# Patient Record
Sex: Female | Born: 1990 | Race: Black or African American | Hispanic: No | Marital: Single | Smoking: Never smoker
Health system: Southern US, Community
[De-identification: ages and names within clinical notes are randomized; demographics above are authoritative.]

## PROBLEM LIST (undated history)

## (undated) ENCOUNTER — Inpatient Hospital Stay (HOSPITAL_COMMUNITY): Payer: Self-pay

## (undated) DIAGNOSIS — Z789 Other specified health status: Secondary | ICD-10-CM

## (undated) HISTORY — PX: NO PAST SURGERIES: SHX2092

---
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
## 2013-05-06 ENCOUNTER — Inpatient Hospital Stay (HOSPITAL_COMMUNITY)
Admission: AD | Admit: 2013-05-06 | Discharge: 2013-05-06 | Disposition: A | Discharge status: Home / Self Care | Payer: Medicaid Other | Source: Ambulatory Visit | Attending: Obstetrics & Gynecology | Admitting: Obstetrics & Gynecology

## 2013-05-06 ENCOUNTER — Encounter (HOSPITAL_COMMUNITY): Payer: Self-pay | Admitting: *Deleted

## 2013-05-06 DIAGNOSIS — O210 Mild hyperemesis gravidarum: Secondary | ICD-10-CM | POA: Insufficient documentation

## 2013-05-06 DIAGNOSIS — O219 Vomiting of pregnancy, unspecified: Secondary | ICD-10-CM

## 2013-05-06 HISTORY — DX: Other specified health status: Z78.9

## 2013-05-06 LAB — COMPREHENSIVE METABOLIC PANEL
ALT: 28 U/L (ref 0–35)
Alkaline Phosphatase: 53 U/L (ref 39–117)
BUN: 16 mg/dL (ref 6–23)
CO2: 21 mEq/L (ref 19–32)
Calcium: 10.3 mg/dL (ref 8.4–10.5)
Creatinine, Ser: 0.54 mg/dL (ref 0.50–1.10)
GFR calc Af Amer: >90 mL/min (ref >90)
GFR calc non Af Amer: >90 mL/min (ref >90)
Glucose, Bld: 89 mg/dL (ref 70–99)
Potassium: 3.8 mEq/L (ref 3.5–5.1)
Sodium: 134 mEq/L — ABNORMAL LOW (ref 135–145)
Total Protein: 8.1 g/dL (ref 6.0–8.3)

## 2013-05-06 LAB — URINALYSIS, ROUTINE W REFLEX MICROSCOPIC
Glucose, UA: NEGATIVE mg/dL
Ketones, ur: >80 mg/dL — AB
Leukocytes, UA: NEGATIVE
Nitrite: NEGATIVE
Specific Gravity, Urine: >1.03 — ABNORMAL HIGH (ref 1.005–1.030)
Urobilinogen, UA: 1 mg/dL (ref 0.0–1.0)

## 2013-05-06 LAB — POCT PREGNANCY, URINE: Preg Test, Ur: POSITIVE — AB

## 2013-05-06 LAB — CBC
HCT: 37.6 % (ref 36.0–46.0)
Hemoglobin: 13.4 g/dL (ref 12.0–15.0)
MCH: 28.2 pg (ref 26.0–34.0)
MCHC: 35.6 g/dL (ref 30.0–36.0)
MCV: 79.2 fL (ref 78.0–100.0)
WBC: 8.1 10*3/uL (ref 4.0–10.5)

## 2013-05-06 LAB — URINE MICROSCOPIC-ADD ON

## 2013-05-06 MED ORDER — PROMETHAZINE HCL 25 MG PO TABS: 25.0000 mg | ORAL_TABLET | Freq: Four times a day (QID) | ORAL | Status: DC | PRN

## 2013-05-06 MED ORDER — DEXTROSE IN LACTATED RINGERS 5 % IV SOLN
25.0000 mg | Freq: Once | INTRAVENOUS | Status: AC
  Administered 2013-05-06: 25 mg via INTRAVENOUS
  Filled 2013-05-06: qty 1

## 2013-05-06 MED ORDER — SODIUM CHLORIDE 0.9 % IV SOLN
Freq: Once | INTRAVENOUS | Status: AC
  Administered 2013-05-06: 13:00:00 via INTRAVENOUS
  Filled 2013-05-06: qty 1000

## 2013-05-06 MED ORDER — ONDANSETRON HCL 4 MG PO TABS: 4.0000 mg | ORAL_TABLET | Freq: Four times a day (QID) | ORAL | Status: DC

## 2013-05-06 NOTE — MAU Note (Signed)
Pt reprots she has had n/v for the past 4 days. Unable to keep anything down. Had pregnancy confirmed at health dept today.

## 2013-05-06 NOTE — MAU Note (Deleted)
Patient presents with complaint of hyperemesis gravidarum X 4 days.

## 2013-05-06 NOTE — MAU Provider Note (Signed)
History     CSN: [id number]  Arrival date and time: 05/06/13 3244   First Provider Initiated Contact with Patient 05/06/13 1010      Chief Complaint  Patient presents with  . Emesis During Pregnancy   HPI Ms. [name] is a 22 y.o. G1P0 at [redacted]w[redacted]d who presents to MAU today with N/V x 4-5 days. The patient had a +UPT at [facility] today. She states that can't keep anything down. She is also having some weakness. Denies abdominal pain, vaginal bleeding, discharge, fever, dizziness, UTI symptoms, diarrhea or heartburn. Last BM was 2 days ago.   OB History   Grav Para Term Preterm Abortions TAB SAB Ect Mult Living   1               Past Medical History  Diagnosis Date  . Medical history non-contributory     Past Surgical History  Procedure Laterality Date  . No past surgeries      Family History  Problem Relation Age of Onset  . Hypertension Mother   . Diabetes Maternal Grandmother     History  Substance Use Topics  . Smoking status: Never Smoker   . Smokeless tobacco: Never Used  . Alcohol Use: Yes    Allergies: No Known Allergies  No prescriptions prior to admission    Review of Systems  Constitutional: Negative for fever and malaise/fatigue.  Gastrointestinal: Positive for nausea, vomiting and constipation. Negative for abdominal pain and diarrhea.  Genitourinary: Negative for dysuria, urgency and frequency.       Neg - vaginal bleeding, discharge  Neurological: Positive for weakness. Negative for dizziness and loss of consciousness.   Physical Exam   Blood pressure 111/54, pulse 98, temperature 98.6 F (37 C), temperature source Oral, resp. rate 18, height 5' 5.5" (1.664 m), weight 101 lb 8 oz (46.04 kg), last menstrual period 03/19/2013.  Physical Exam  Constitutional: She is oriented to person, place, and time. She appears well-developed and well-nourished. No distress.  HENT:  Head: Normocephalic and atraumatic.  Cardiovascular: Normal rate, regular  rhythm and normal heart sounds.   Respiratory: Effort normal and breath sounds normal. No respiratory distress.  GI: Soft. Bowel sounds are normal. She exhibits no distension and no mass. There is tenderness (mild epigastric discomfort). There is no rebound and no guarding.  Neurological: She is alert and oriented to person, place, and time.  Skin: Skin is warm and dry. No erythema.  Psychiatric: She has a normal mood and affect.   Results for orders placed during the hospital encounter of 05/06/13 (from the past 24 hour(s))  URINALYSIS, ROUTINE W REFLEX MICROSCOPIC     Status: Abnormal   Collection Time    05/06/13  9:51 AM      Result Value Range   Color, Urine YELLOW  YELLOW   APPearance CLEAR  CLEAR   Specific Gravity, Urine >1.030 (*) 1.005 - 1.030   pH 6.0  5.0 - 8.0   Glucose, UA NEGATIVE  NEGATIVE mg/dL   Hgb urine dipstick TRACE (*) NEGATIVE   Bilirubin Urine SMALL (*) NEGATIVE   Ketones, ur >80 (*) NEGATIVE mg/dL   Protein, ur 010 (*) NEGATIVE mg/dL   Urobilinogen, UA 1.0  0.0 - 1.0 mg/dL   Nitrite NEGATIVE  NEGATIVE   Leukocytes, UA NEGATIVE  NEGATIVE  URINE MICROSCOPIC-ADD ON     Status: None   Collection Time    05/06/13  9:51 AM      Result Value Range  Squamous Epithelial / LPF RARE  RARE   WBC, UA 0-2  <3 WBC/hpf   RBC / HPF 0-2  <3 RBC/hpf   Bacteria, UA RARE  RARE   Urine-Other MUCOUS PRESENT    POCT PREGNANCY, URINE     Status: Abnormal   Collection Time    05/06/13  9:54 AM      Result Value Range   Preg Test, Ur POSITIVE (*) NEGATIVE  CBC     Status: None   Collection Time    05/06/13 11:00 AM      Result Value Range   WBC 8.1  4.0 - 10.5 K/uL   RBC 4.75  3.87 - 5.11 MIL/uL   Hemoglobin 13.4  12.0 - 15.0 g/dL   HCT 91.4  78.2 - 95.6 %   MCV 79.2  78.0 - 100.0 fL   MCH 28.2  26.0 - 34.0 pg   MCHC 35.6  30.0 - 36.0 g/dL   RDW 21.3  08.6 - 57.8 %   Platelets 300  150 - 400 K/uL  COMPREHENSIVE METABOLIC PANEL     Status: Abnormal   Collection Time     05/06/13 11:00 AM      Result Value Range   Sodium 134 (*) 135 - 145 mEq/L   Potassium 3.8  3.5 - 5.1 mEq/L   Chloride 97  96 - 112 mEq/L   CO2 21  19 - 32 mEq/L   Glucose, Bld 89  70 - 99 mg/dL   BUN 16  6 - 23 mg/dL   Creatinine, Ser 4.69  0.50 - 1.10 mg/dL   Calcium 62.9  8.4 - 52.8 mg/dL   Total Protein 8.1  6.0 - 8.3 g/dL   Albumin 4.4  3.5 - 5.2 g/dL   AST 35  0 - 37 U/L   ALT 28  0 - 35 U/L   Alkaline Phosphatase 53  39 - 117 U/L   Total Bilirubin 0.6  0.3 - 1.2 mg/dL   GFR calc non Af Amer >90  >90 mL/min   GFR calc Af Amer >90  >90 mL/min     MAU Course  Procedures None  MDM +UPT UA shows signs of dehydration 25 mg IV Phenergan infusion in 1 liter D5LR CBC, CMP today 1 liter NS with MV given Patient able to tolerate PO in MAU  Assessment and Plan  A: Nausea and vomiting in pregnancy prior to [redacted] weeks gestation  P: Discharge home Rx for Phenergan and Zofran sent to patient's pharmacy Discussed hyperemesis diet and first trimester warning signs Patient unsure of where she would like to go for prenatal care. Given a list of area OB providers Patient may return to MAU as needed or if her condition were to change or worsen  Freddi Starr, PA-C  05/06/2013, 2:28 PM

## 2013-05-06 NOTE — MAU Note (Signed)
Patient presents with complaint of vomiting X 4 days.

## 2013-05-09 NOTE — MAU Provider Note (Signed)
Attestation of Attending Supervision of Advanced Practitioner (CNM/NP): Evaluation and management procedures were performed by the Advanced Practitioner under my supervision and collaboration. I have reviewed the Advanced Practitioner's note and chart, and I agree with the management and plan.  LEGGETT,KELLY H. 12:51 PM

## 2013-05-27 LAB — OB RESULTS CONSOLE HIV ANTIBODY (ROUTINE TESTING): HIV: NONREACTIVE

## 2013-05-27 LAB — CULTURE, OB URINE: Urine Culture, OB: NEGATIVE

## 2013-05-27 LAB — OB RESULTS CONSOLE VARICELLA ZOSTER ANTIBODY, IGG: Varicella: NON-IMMUNE/NOT IMMUNE

## 2013-05-27 LAB — OB RESULTS CONSOLE RUBELLA ANTIBODY, IGM: Rubella: IMMUNE

## 2013-05-27 LAB — OB RESULTS CONSOLE ANTIBODY SCREEN: Antibody Screen: NEGATIVE

## 2013-05-27 LAB — CYSTIC FIBROSIS DIAGNOSTIC STUDY: Interpretation-CFDNA:: NEGATIVE

## 2013-05-27 LAB — OB RESULTS CONSOLE ABO/RH: RH Type: POSITIVE

## 2013-05-27 LAB — OB RESULTS CONSOLE GC/CHLAMYDIA
Chlamydia: NEGATIVE
Gonorrhea: NEGATIVE

## 2013-05-27 LAB — OB RESULTS CONSOLE HEPATITIS B SURFACE ANTIGEN: Hepatitis B Surface Ag: NEGATIVE

## 2013-05-27 LAB — DRUG SCREEN, URINE: Drug Screen, Urine: NEGATIVE

## 2013-05-27 LAB — OB RESULTS CONSOLE RPR: RPR: NONREACTIVE

## 2013-06-15 ENCOUNTER — Other Ambulatory Visit (HOSPITAL_COMMUNITY): Payer: Self-pay | Admitting: Family

## 2013-06-15 DIAGNOSIS — Z3682 Encounter for antenatal screening for nuchal translucency: Secondary | ICD-10-CM

## 2013-06-16 ENCOUNTER — Other Ambulatory Visit (HOSPITAL_COMMUNITY): Payer: Self-pay | Admitting: Family

## 2013-06-16 ENCOUNTER — Ambulatory Visit (HOSPITAL_COMMUNITY)
Admission: RE | Admit: 2013-06-16 | Discharge: 2013-06-16 | Disposition: A | Discharge status: Home / Self Care | Payer: Medicaid Other | Source: Ambulatory Visit | Attending: Sports Medicine | Admitting: Sports Medicine

## 2013-06-16 ENCOUNTER — Ambulatory Visit (HOSPITAL_COMMUNITY): Admission: RE | Admit: 2013-06-16 | Payer: Medicaid Other | Source: Ambulatory Visit

## 2013-06-16 ENCOUNTER — Ambulatory Visit (HOSPITAL_COMMUNITY)
Admission: RE | Admit: 2013-06-16 | Discharge: 2013-06-16 | Disposition: A | Discharge status: Home / Self Care | Payer: Medicaid Other | Source: Ambulatory Visit

## 2013-06-16 DIAGNOSIS — O3680X0 Pregnancy with inconclusive fetal viability, not applicable or unspecified: Secondary | ICD-10-CM | POA: Insufficient documentation

## 2013-06-16 DIAGNOSIS — Z3682 Encounter for antenatal screening for nuchal translucency: Secondary | ICD-10-CM

## 2013-06-16 DIAGNOSIS — Z3689 Encounter for other specified antenatal screening: Secondary | ICD-10-CM | POA: Insufficient documentation

## 2013-06-16 IMAGING — US US MFM OB COMPLETE +14 WKS
1 series · 13 of 24 positions shown · non-contrast
Comparison: none

[Series 1: us mfm ob complete +14 wks · 0.21mm/px · 13 of 24 slices shown]
[im 1/24]
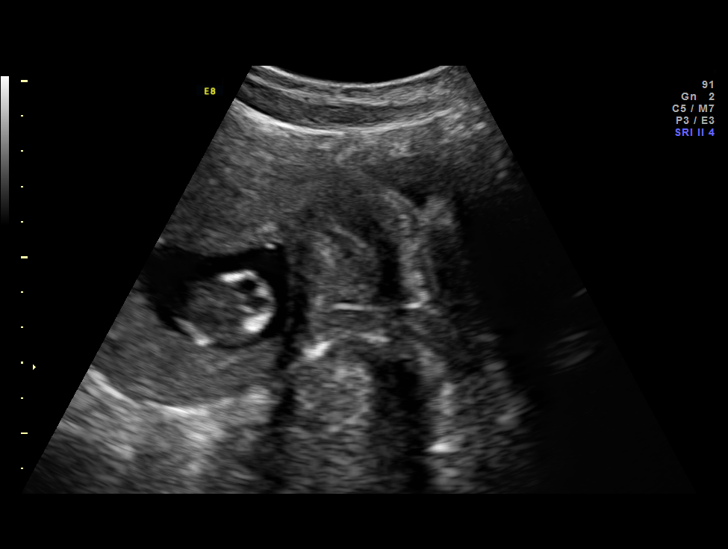
[im 3/24]
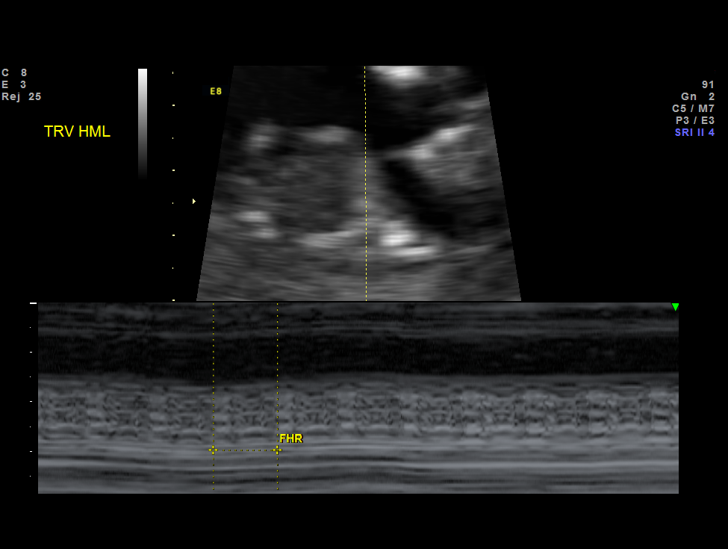
[im 5/24]
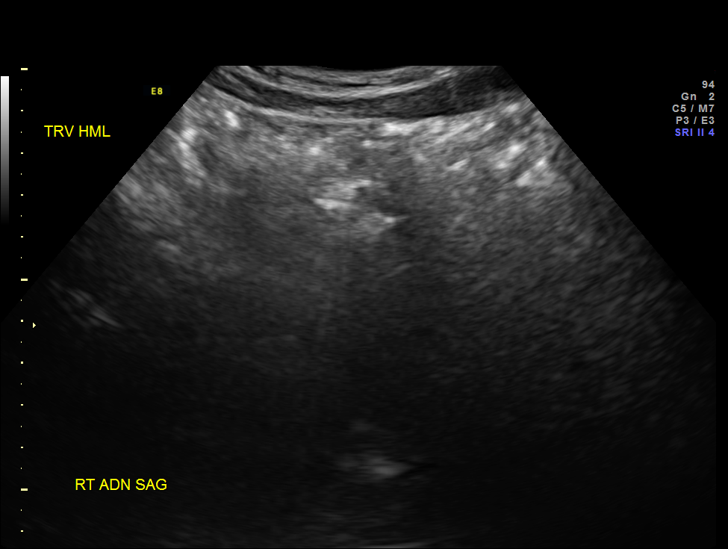
[im 7/24]
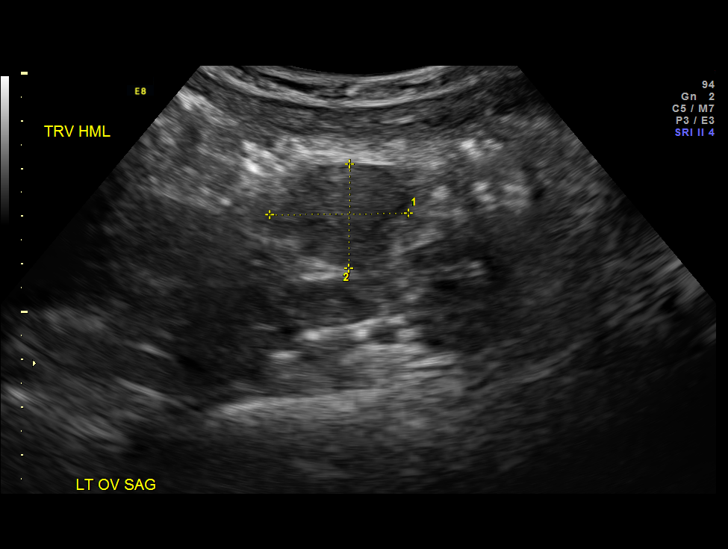
[im 9/24]
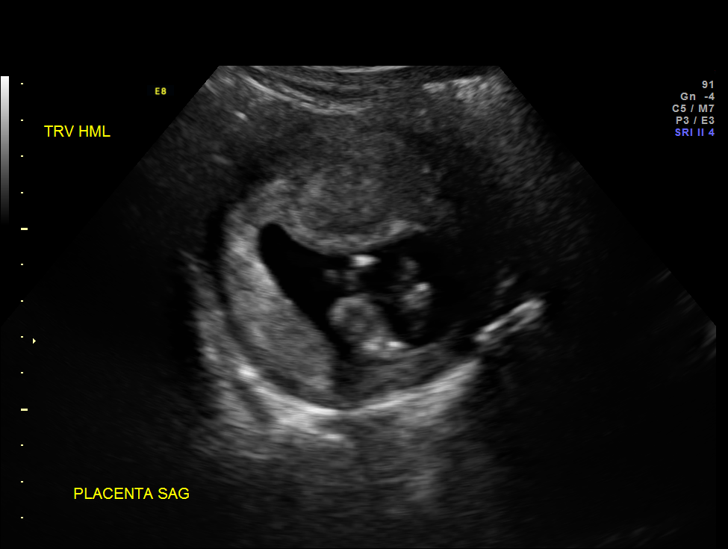
[im 11/24]
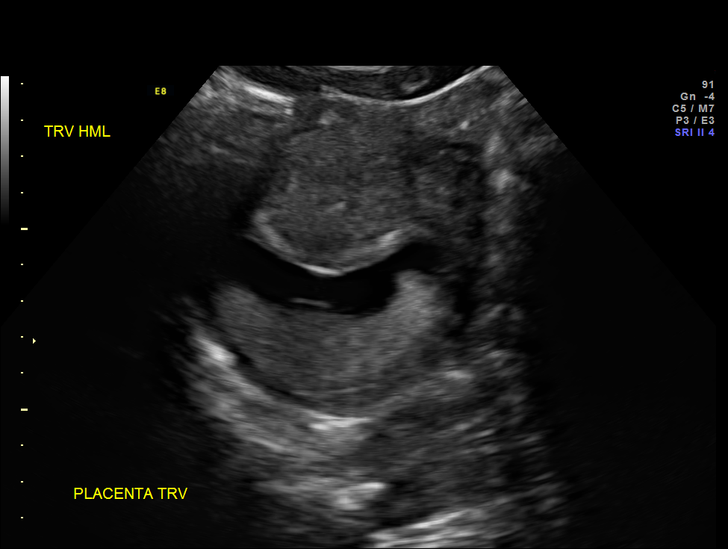
[im 13/24]
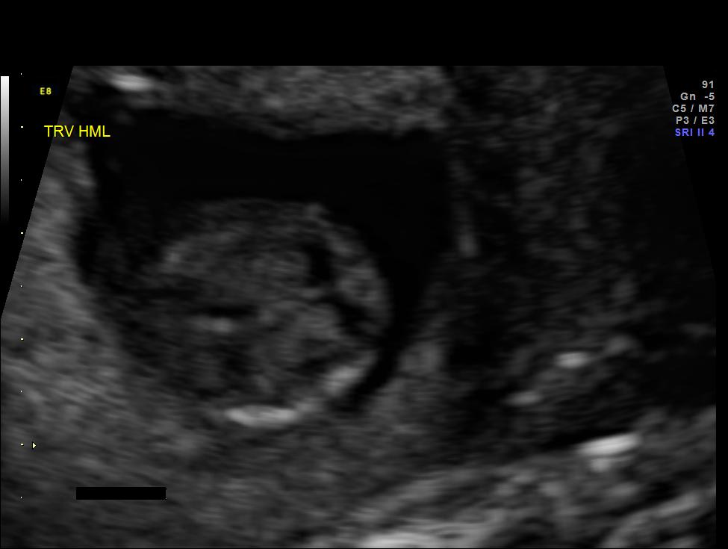
[im 14/24]
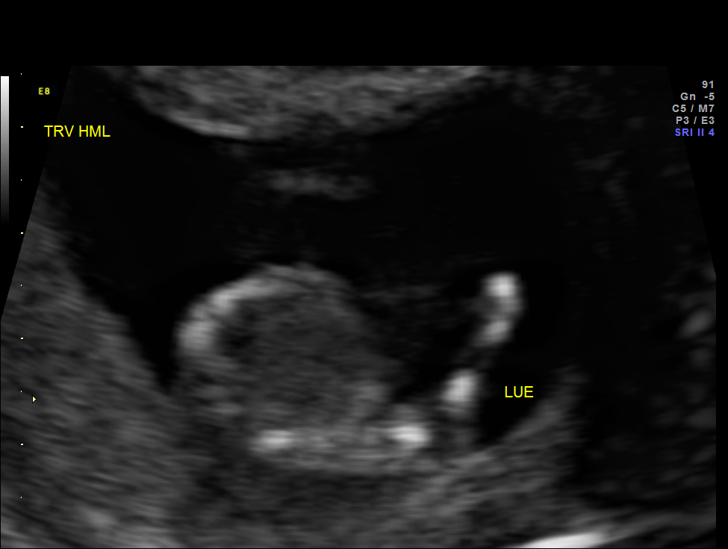
[im 16/24]
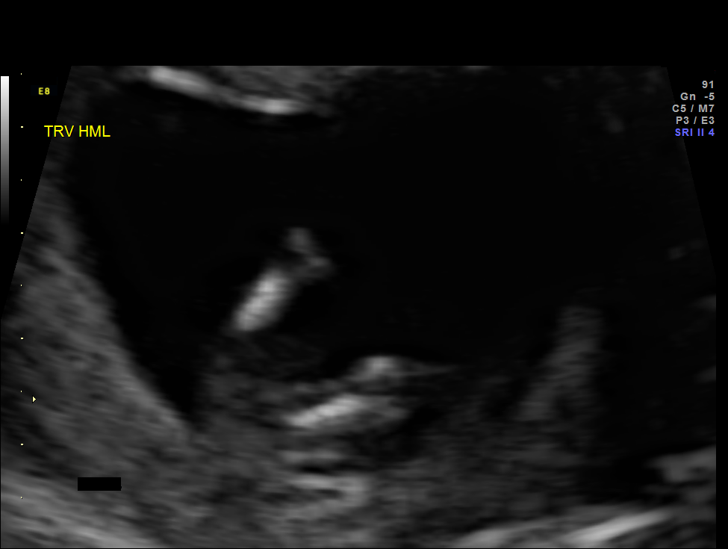
[im 18/24]
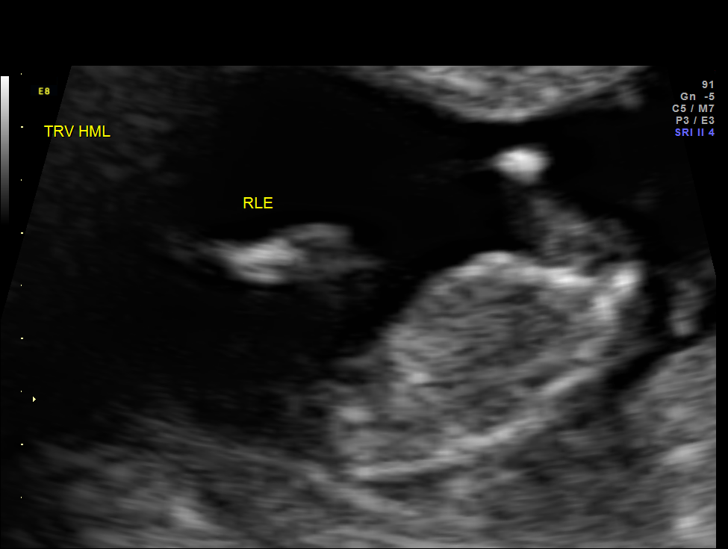
[im 20/24]
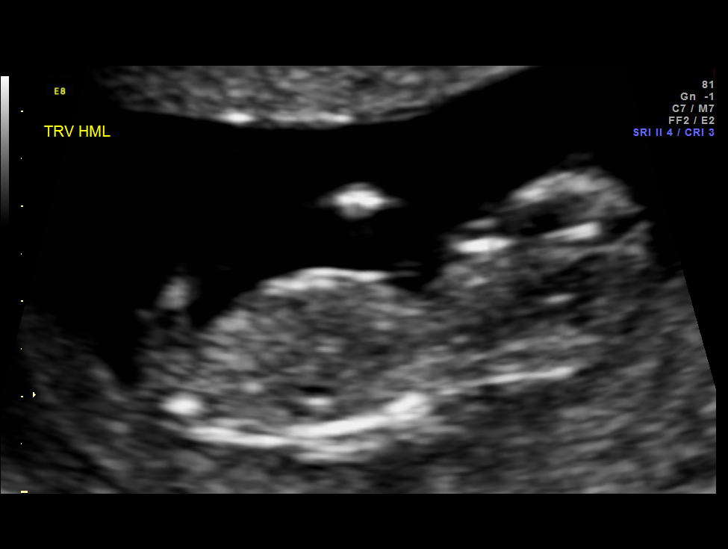
[im 22/24]
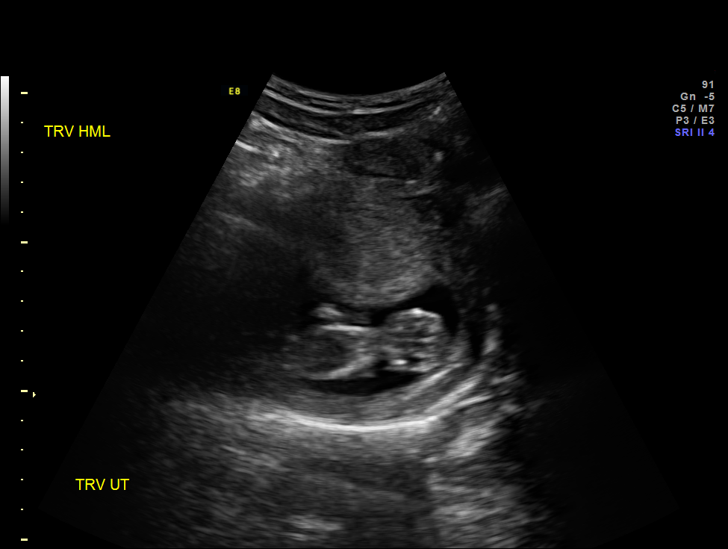
[im 24/24]
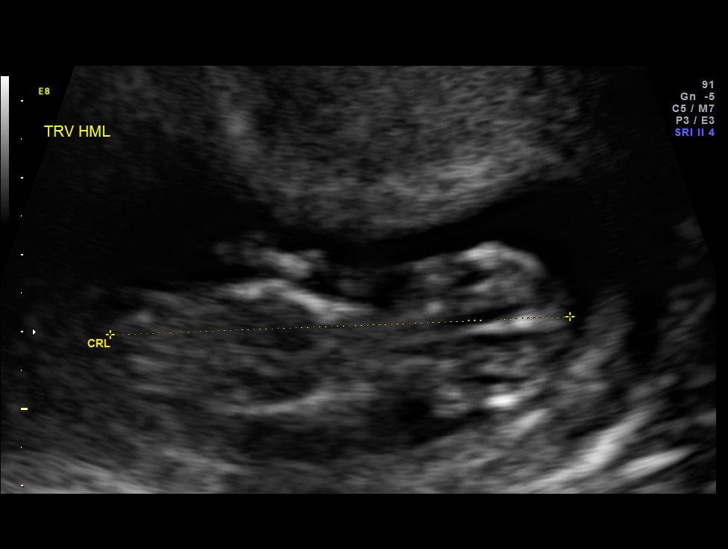

[13 of 24 positions shown; findings below may reference images not displayed]

OBSTETRICS REPORT
                      (Signed Final [DATE] [DATE])

Service(s) Provided

 US MFM OB COMP LESS THAN 14 WEEKS                     76801.4
Indications

 Pregnancy with inconclusive fetal viability           [BY]
Fetal Evaluation

 Num Of Fetuses:    1
 Fetal Heart Rate:  157                          bpm
 Cardiac Activity:  Observed
 Presentation:      Transverse, head to
                    maternal left

 Amniotic Fluid
 AFI FV:      Subjectively within normal limits
Biometry

 CRL:     60.1  mm     G. Age:  12w 2d                 EDD:    [DATE]
Gestational Age

 LMP:           12w 5d        Date:  [DATE]                 EDD:   [DATE]
 Best:          12w 5d     Det. By:  LMP  ([DATE])          EDD:   [DATE]
Anatomy

 Cranium:          Appears normal         Cord Vessels:     Appears normal (3
                                                            vessel cord)
 Choroid Plexus:   Appears normal         Lower             Visualized
                                          Extremities:
 Stomach:          Appears normal, left   Upper             Visualized
                   sided                  Extremities:

 Other:  Technically difficult due to uterine position.
Cervix Uterus Adnexa

 Cervix:       Normal appearance by transabdominal scan.
 Uterus:       Retroverted.

 Left Ovary:    Within normal limits.
 Right Ovary:   Not visualized.
 Adnexa:     No abnormality visualized.
Impression

 Single IUP at 12 [DATE] weeks
 Unable to measure an andequate NT due to fetal position
Recommendations

 Recommend follow up next week for second attempt at first
 trimester screen.

 questions or concerns.

## 2013-06-17 NOTE — L&D Delivery Note (Signed)
Delivery Note Called to patient room for delivery. Patient pushed over intact perineum. At 10:07 PM a viable female was delivered via Vaginal, Spontaneous Delivery (Presentation: Right Occiput Anterior).  Infant delivered to maternal abdomen. APGAR: 9, 9; weight pending. Cord clamped x 2 and cut. Active management of third stage with traction, started Pitocin once placenta delivered. Placenta status: Intact, Spontaneous.  Cord: 3 vessels with the following complications: None.  Cord pH: n/a. Laceration repaired as below in usual fashion. Counts correct. Hemostatic. All questions answered, no additional patient concerns.   Anesthesia: Local  Episiotomy: None Lacerations: 1st degree;Perineal. 2nd degree vaginal Suture Repair: 3.0 vicryl rapide Est. Blood Loss (mL): 250  Mom to postpartum.  Baby to Couplet care / Skin to Skin. CNM present for entire delivery.   Sunnie Nielsenlexander, Natalie 12/12/2013, 10:43 PM

## 2013-06-17 NOTE — L&D Delivery Note (Signed)
I was present for delivery and agree with note above. Walidah N [name], CNM  

## 2013-06-22 ENCOUNTER — Other Ambulatory Visit (HOSPITAL_COMMUNITY): Payer: Self-pay | Admitting: Nurse Practitioner

## 2013-06-22 DIAGNOSIS — Z3682 Encounter for antenatal screening for nuchal translucency: Secondary | ICD-10-CM

## 2013-06-24 ENCOUNTER — Ambulatory Visit (HOSPITAL_COMMUNITY)
Admission: RE | Admit: 2013-06-24 | Discharge: 2013-06-24 | Disposition: A | Discharge status: Home / Self Care | Payer: Medicaid Other | Source: Ambulatory Visit | Attending: Nurse Practitioner | Admitting: Nurse Practitioner

## 2013-06-24 ENCOUNTER — Other Ambulatory Visit (HOSPITAL_COMMUNITY): Payer: Self-pay | Admitting: Nurse Practitioner

## 2013-06-24 DIAGNOSIS — Z3682 Encounter for antenatal screening for nuchal translucency: Secondary | ICD-10-CM

## 2013-06-24 DIAGNOSIS — Z3689 Encounter for other specified antenatal screening: Secondary | ICD-10-CM

## 2013-06-24 DIAGNOSIS — O3510X0 Maternal care for (suspected) chromosomal abnormality in fetus, unspecified, not applicable or unspecified: Secondary | ICD-10-CM | POA: Insufficient documentation

## 2013-06-24 DIAGNOSIS — O351XX0 Maternal care for (suspected) chromosomal abnormality in fetus, not applicable or unspecified: Secondary | ICD-10-CM | POA: Insufficient documentation

## 2013-06-29 ENCOUNTER — Other Ambulatory Visit: Payer: Self-pay

## 2013-08-05 ENCOUNTER — Ambulatory Visit (HOSPITAL_COMMUNITY)
Admission: RE | Admit: 2013-08-05 | Discharge: 2013-08-05 | Disposition: A | Discharge status: Home / Self Care | Payer: Medicaid Other | Source: Ambulatory Visit | Attending: Nurse Practitioner | Admitting: Nurse Practitioner

## 2013-08-05 DIAGNOSIS — Z3689 Encounter for other specified antenatal screening: Secondary | ICD-10-CM | POA: Insufficient documentation

## 2013-09-14 ENCOUNTER — Observation Stay (HOSPITAL_COMMUNITY)
Admission: AD | Admit: 2013-09-14 | Discharge: 2013-09-16 | Disposition: A | Discharge status: Home / Self Care | Payer: Medicaid Other | Source: Ambulatory Visit | Attending: Obstetrics & Gynecology | Admitting: Obstetrics & Gynecology

## 2013-09-14 ENCOUNTER — Encounter (HOSPITAL_COMMUNITY): Payer: Self-pay | Admitting: *Deleted

## 2013-09-14 ENCOUNTER — Inpatient Hospital Stay (HOSPITAL_COMMUNITY): Payer: Medicaid Other

## 2013-09-14 DIAGNOSIS — O4700 False labor before 37 completed weeks of gestation, unspecified trimester: Principal | ICD-10-CM | POA: Insufficient documentation

## 2013-09-14 DIAGNOSIS — R1032 Left lower quadrant pain: Secondary | ICD-10-CM | POA: Insufficient documentation

## 2013-09-14 LAB — URINALYSIS, ROUTINE W REFLEX MICROSCOPIC
BILIRUBIN URINE: NEGATIVE
GLUCOSE, UA: NEGATIVE mg/dL
HGB URINE DIPSTICK: NEGATIVE
Ketones, ur: NEGATIVE mg/dL
Leukocytes, UA: NEGATIVE
Nitrite: NEGATIVE
PROTEIN: 30 mg/dL — AB
Specific Gravity, Urine: >1.03 — ABNORMAL HIGH (ref 1.005–1.030)
Urobilinogen, UA: 0.2 mg/dL (ref 0.0–1.0)
pH: 6 (ref 5.0–8.0)

## 2013-09-14 LAB — URINE MICROSCOPIC-ADD ON

## 2013-09-14 MED ORDER — LACTATED RINGERS IV BOLUS (SEPSIS)
500.0000 mL | Freq: Once | INTRAVENOUS | Status: AC
  Administered 2013-09-14: 500 mL via INTRAVENOUS

## 2013-09-14 MED ORDER — INDOMETHACIN 25 MG PO CAPS
50.0000 mg | ORAL_CAPSULE | Freq: Once | ORAL | Status: AC
  Administered 2013-09-14: 50 mg via ORAL
  Filled 2013-09-14: qty 2

## 2013-09-14 MED ORDER — ACETAMINOPHEN 325 MG PO TABS: 650.0000 mg | ORAL_TABLET | ORAL | Status: DC | PRN

## 2013-09-14 MED ORDER — NIFEDIPINE 10 MG PO CAPS
20.0000 mg | ORAL_CAPSULE | Freq: Once | ORAL | Status: AC
  Administered 2013-09-14: 20 mg via ORAL
  Filled 2013-09-14: qty 2

## 2013-09-14 MED ORDER — INDOMETHACIN 25 MG PO CAPS
25.0000 mg | ORAL_CAPSULE | Freq: Four times a day (QID) | ORAL | Status: DC
  Administered 2013-09-15 – 2013-09-16 (×5): 25 mg via ORAL
  Filled 2013-09-14 (×6): qty 1

## 2013-09-14 MED ORDER — DOCUSATE SODIUM 100 MG PO CAPS
100.0000 mg | ORAL_CAPSULE | Freq: Every day | ORAL | Status: DC
  Administered 2013-09-15: 100 mg via ORAL
  Filled 2013-09-14: qty 1

## 2013-09-14 MED ORDER — NIFEDIPINE 10 MG PO CAPS
10.0000 mg | ORAL_CAPSULE | Freq: Once | ORAL | Status: AC
  Administered 2013-09-14: 10 mg via ORAL
  Filled 2013-09-14: qty 1

## 2013-09-14 MED ORDER — INDOMETHACIN 25 MG PO CAPS
25.0000 mg | ORAL_CAPSULE | Freq: Once | ORAL | Status: AC
  Administered 2013-09-14: 25 mg via ORAL
  Filled 2013-09-14: qty 1

## 2013-09-14 MED ORDER — BETAMETHASONE SOD PHOS & ACET 6 (3-3) MG/ML IJ SUSP
12.0000 mg | INTRAMUSCULAR | Status: AC
  Administered 2013-09-14 – 2013-09-16 (×2): 12 mg via INTRAMUSCULAR
  Filled 2013-09-14 (×2): qty 2

## 2013-09-14 MED ORDER — NIFEDIPINE 10 MG PO CAPS: 10.0000 mg | ORAL_CAPSULE | Freq: Once | ORAL | Status: DC

## 2013-09-14 MED ORDER — ZOLPIDEM TARTRATE 5 MG PO TABS: 5.0000 mg | ORAL_TABLET | Freq: Every evening | ORAL | Status: DC | PRN

## 2013-09-14 MED ORDER — LACTATED RINGERS IV SOLN
INTRAVENOUS | Status: DC
  Administered 2013-09-15: 125 mL/h via INTRAVENOUS
  Administered 2013-09-15 (×2): via INTRAVENOUS

## 2013-09-14 MED ORDER — CALCIUM CARBONATE ANTACID 500 MG PO CHEW: 2.0000 | CHEWABLE_TABLET | ORAL | Status: DC | PRN

## 2013-09-14 MED ORDER — PRENATAL MULTIVITAMIN CH
1.0000 | ORAL_TABLET | Freq: Every day | ORAL | Status: DC
  Administered 2013-09-15: 1 via ORAL
  Filled 2013-09-14: qty 1

## 2013-09-14 NOTE — Progress Notes (Signed)
-   GBS collected and sent.

## 2013-09-14 NOTE — MAU Note (Signed)
Patient states she started having sharp pain in the lower left abdomen yesterday, getting worse. Denies bleeding, leaking, nausea, vomiting or diarrhea. Reports good fetal movement.

## 2013-09-14 NOTE — Progress Notes (Signed)
Another pitcher water to pt

## 2013-09-14 NOTE — Progress Notes (Signed)
Transducer d/ced per Wynelle BourgeoisMarie Williams CNM and will continue toco for ctxs

## 2013-09-14 NOTE — Progress Notes (Signed)
Report called to W. Muhammed CNM regarding pt's admission. Aware of ctx pattern and pushing po flds to pt. Will see pt

## 2013-09-14 NOTE — MAU Provider Note (Signed)
History     CSN: [id number]  Arrival date and time: 09/14/13 1528   First Provider Initiated Contact with Patient 09/14/13 2257     Chief Complaint  Patient presents with  . Abdominal Pain   HPI This is a 23 y.o. female at 8158w4d who ptesents with c/o pain in LLQ, sharp. Denies leaking or bleeding.   RN Note;  Patient states she started having sharp pain in the lower left abdomen yesterday, getting worse. Denies bleeding, leaking, nausea, vomiting or diarrhea. Reports good fetal movement.        OB History   Grav Para Term Preterm Abortions TAB SAB Ect Mult Living   1               Past Medical History  Diagnosis Date  . Medical history non-contributory     Past Surgical History  Procedure Laterality Date  . No past surgeries      Family History  Problem Relation Age of Onset  . Hypertension Mother   . Diabetes Maternal Grandmother     History  Substance Use Topics  . Smoking status: Never Smoker   . Smokeless tobacco: Never Used  . Alcohol Use: No    Allergies: No Known Allergies  Prescriptions prior to admission  Medication Sig Dispense Refill  . flintstones complete (FLINTSTONES) 60 MG chewable tablet Chew 2 tablets by mouth daily.        Review of Systems  Constitutional: Negative for fever, chills and malaise/fatigue.  Gastrointestinal: Positive for abdominal pain. Negative for nausea, vomiting, diarrhea and constipation.  Genitourinary: Negative for dysuria.  Neurological: Negative for dizziness.   Physical Exam   Blood pressure 118/68, pulse 80, temperature 99 F (37.2 C), temperature source Oral, resp. rate 16, height 5\' 5"  (1.651 m), weight 52.708 kg (116 lb 3.2 oz), last menstrual period 03/19/2013, SpO2 100.00%.  Physical Exam  Constitutional: She is oriented to person, place, and time. She appears well-developed and well-nourished. No distress.  Cardiovascular: Normal rate.   Respiratory: Effort normal.  GI: She exhibits no  distension. There is tenderness. There is no rebound and no guarding.  Genitourinary: Vagina normal and uterus normal. No vaginal discharge found.  Dilation: Closed Effacement (%): 20 Exam by:: Wynelle BourgeoisMarie Williams CNM   Musculoskeletal: Normal range of motion.  Neurological: She is alert and oriented to person, place, and time.  Skin: Skin is warm and dry.  Psychiatric: She has a normal mood and affect.    MAU Course  Procedures  MDM  1930: RN reports unchanged contraction pattern; IVF's ordered  1955: Has had Procardia x several doses. UCs have decreased somewhat but are still occurring.            Still having some contractions. Discussed with Dr Penne LashLeggett, will try indocin.   Assessment and Plan  Report to oncoming CNM  [facility] Center[facility]ILLIAMS,MARIE 09/14/2013, 4:29 PM   Results for orders placed during the hospital encounter of 09/14/13 (from the past 48 hour(s))  URINALYSIS, ROUTINE W REFLEX MICROSCOPIC     Status: Abnormal   Collection Time    09/14/13  3:45 PM      Result Value Ref Range   Color, Urine YELLOW  YELLOW   APPearance CLEAR  CLEAR   Specific Gravity, Urine >1.030 (*) 1.005 - 1.030   pH 6.0  5.0 - 8.0   Glucose, UA NEGATIVE  NEGATIVE mg/dL   Hgb urine dipstick NEGATIVE  NEGATIVE   Bilirubin Urine NEGATIVE  NEGATIVE   Ketones,  ur NEGATIVE  NEGATIVE mg/dL   Protein, ur 30 (*) NEGATIVE mg/dL   Urobilinogen, UA 0.2  0.0 - 1.0 mg/dL   Nitrite NEGATIVE  NEGATIVE   Leukocytes, UA NEGATIVE  NEGATIVE  URINE MICROSCOPIC-ADD ON     Status: Abnormal   Collection Time    09/14/13  3:45 PM      Result Value Ref Range   Squamous Epithelial / LPF FEW (*) RARE   WBC, UA 0-2  <3 WBC/hpf   Urine-Other MUCOUS PRESENT      Dorathy Kinsman, CNM assumed care of pt.  OB limited CL 3.1 cm. Vtx.  Contractions decreased in strength and frequency, but still tracing. Pt barely aware of them any more. VE unchanged.  Consulted w/ Dr. Penne Lash.  Will Obs on antenatal and give BMZ. Continue  Indocin.  [place], CNM 09/15/2013 11:24 AM

## 2013-09-15 DIAGNOSIS — O4700 False labor before 37 completed weeks of gestation, unspecified trimester: Principal | ICD-10-CM

## 2013-09-15 NOTE — H&P (Signed)
History    CSN: [id number]  Arrival date and time: 09/14/13 1528  First Provider Initiated Contact with Patient 09/14/13 2257  Chief Complaint   Patient presents with   .  Abdominal Pain    HPI  This is a 23 y.o. female at 7045w4d who ptesents with c/o pain in LLQ, sharp. Denies leaking or bleeding.   [facility] Pregnancy Uncomplicated. Last IC >24 hours ago.   OB History    Grav  Para  Term  Preterm  Abortions  TAB  SAB  Ect  Mult  Living    1               Past Medical History   Diagnosis  Date   .  Medical history non-contributory     Past Surgical History   Procedure  Laterality  Date   .  No past surgeries      Family History   Problem  Relation  Age of Onset   .  Hypertension  Mother    .  Diabetes  Maternal Grandmother     History   Substance Use Topics   .  Smoking status:  Never Smoker   .  Smokeless tobacco:  Never Used   .  Alcohol Use:  No    Allergies: No Known Allergies  Prescriptions prior to admission   Medication  Sig  Dispense  Refill   .  flintstones complete (FLINTSTONES) 60 MG chewable tablet  Chew 2 tablets by mouth daily.      Review of Systems  Constitutional: Negative for fever, chills and malaise/fatigue.  Gastrointestinal: Positive for abdominal pain. Negative for nausea, vomiting, diarrhea and constipation.  Genitourinary: Negative for dysuria.  Neurological: Negative for dizziness.   Physical Exam   Blood pressure 118/68, pulse 80, temperature 99 F (37.2 C), temperature source Oral, resp. rate 16, height 5\' 5"  (1.651 m), weight 52.708 kg (116 lb 3.2 oz), last menstrual period 03/19/2013, SpO2 100.00%.  Physical Exam  Constitutional: She is oriented to person, place, and time. She appears well-developed and well-nourished. No distress.  Cardiovascular: Normal rate.  Respiratory: Effort normal.  GI: She exhibits no distension. There is tenderness. There is no rebound and no guarding.  Genitourinary: Vagina normal and uterus  normal. No vaginal discharge found.  Dilation: Closed Effacement (%): 20 Exam by:: Wynelle BourgeoisMarie Williams CNM  Musculoskeletal: Normal range of motion.  Neurological: She is alert and oriented to person, place, and time.  Skin: Skin is warm and dry.  Psychiatric: She has a normal mood and affect.   MAU Course   Procedures  MDM  1930: RN reports unchanged contraction pattern; IVF's ordered  1955: Has had Procardia x several doses. UCs have decreased somewhat but are still occurring.  Still having some contractions. Discussed with Dr Penne LashLeggett, will try indocin.  Assessment and Plan   Report to oncoming CNM  [facility]MAR[facility]  09/14/2013, 4:29 PM  Results for orders placed during the hospital encounter of 09/14/13 (from the past 48 hour(s))   URINALYSIS, ROUTINE W REFLEX MICROSCOPIC Status: Abnormal    Collection Time    09/14/13 3:45 PM   Result  Value  Ref Range    Color, Urine  YELLOW  YELLOW    APPearance  CLEAR  CLEAR    Specific Gravity, Urine  >1.030 (*)  1.005 - 1.030    pH  6.0  5.0 - 8.0    Glucose, UA  NEGATIVE  NEGATIVE mg/dL  Hgb urine dipstick  NEGATIVE  NEGATIVE    Bilirubin Urine  NEGATIVE  NEGATIVE    Ketones, ur  NEGATIVE  NEGATIVE mg/dL    Protein, ur  30 (*)  NEGATIVE mg/dL    Urobilinogen, UA  0.2  0.0 - 1.0 mg/dL    Nitrite  NEGATIVE  NEGATIVE    Leukocytes, UA  NEGATIVE  NEGATIVE   URINE MICROSCOPIC-ADD ON Status: Abnormal    Collection Time    09/14/13 3:45 PM   Result  Value  Ref Range    Squamous Epithelial / LPF  FEW (*)  RARE    WBC, UA  0-2  <3 WBC/hpf    Urine-Other  MUCOUS PRESENT     Dorathy Kinsman, CNM assumed care of pt.  OB limited  CL 3.1 cm.  Vtx.  Contractions decreased in strength and frequency, but still tracing. Pt barely aware of them any more. VE unchanged.  Consulted w/ Dr. Penne Lash.  Will Obs on antenatal and give BMZ.  Continue Indocin.  Annetta South, CNM  09/15/2013  11:24 AM

## 2013-09-15 NOTE — Progress Notes (Signed)
Faculty Practice OB/GYN Attending Note  Subjective:  Patient has been stable all day on Indocin, no contractions.  Received 1st betamethasone dose around midnight last night; due to receive 2nd dose tonight.  FHR reassuring, no LOF or vaginal bleeding. Good FM.   Admitted on 09/14/2013 for Preterm uterine contractions, antepartum.   Objective:  Blood pressure 109/57, pulse 79, temperature 98.6 F (37 C), temperature source Oral, resp. rate 18, height 5\' 5"  (1.651 m), weight 116 lb (52.617 kg), last menstrual period 03/19/2013, SpO2 100.00%. FHT  Baseline 150 bpm, moderate variability, +accelerations, no decelerations Toco: none Abdomen: NT gravid fundus, soft Cervix: Deferred as per patient request Ext: 2+ DTRs, no edema, no cyanosis, negative Homan's sign  Assessment & Plan:  23 y.o. G1P0 at 9058w5d admitted for preterm uterine contraction, no sign of progressing labor. Category I FHR tracing. Offered patient the chance to receive her betamethasone dose a little earlier and  be discharged tonight, but she declined this offer. Will give dose at scheduled time, plan for discharge in the morning if she remains stable.  Continue Indocin for now, will complete 72 hour regimen then continue on Procardia if needed.  Continue routine antenatal care.   Jaynie CollinsUGONNA  Davita Sublett, MD, FACOG Attending Obstetrician & Gynecologist Faculty Practice, [facility]

## 2013-09-15 NOTE — Progress Notes (Signed)
Patient ID: [name], female   DOB: 1991/03/09, 23 y.o.   MRN: [id number] FACULTY PRACTICE ANTEPARTUM(COMPREHENSIVE) NOTE  [name] is a 23 y.o. G1P0 at 5428w5d  who is admitted for Preterm labor.   Length of Stay:  1  Days  Subjective: Patient reprots some right lower quadrant pain. Patient reports the fetal movement as active. Patient reports uterine contraction  activity as none. Patient reports  vaginal bleeding as none. Patient describes fluid per vagina as None.  Vitals:  Blood pressure 112/58, pulse 69, temperature 98.1 F (36.7 C), temperature source Oral, resp. rate 18, height 5\' 5"  (1.651 m), weight 116 lb (52.617 kg), last menstrual period 03/19/2013, SpO2 100.00%. Physical Examination:  General appearance - alert, well appearing, and in no distress Abdomen - gravid, non tender Extremities - no edema, redness or tenderness in the calves or thighs RN asked to place pt on montior at 9 am.  Pt is having contractions.  Labs:  Results for orders placed during the hospital encounter of 09/14/13 (from the past 24 hour(s))  URINALYSIS, ROUTINE W REFLEX MICROSCOPIC   Collection Time    09/14/13  3:45 PM      Result Value Ref Range   Color, Urine YELLOW  YELLOW   APPearance CLEAR  CLEAR   Specific Gravity, Urine >1.030 (*) 1.005 - 1.030   pH 6.0  5.0 - 8.0   Glucose, UA NEGATIVE  NEGATIVE mg/dL   Hgb urine dipstick NEGATIVE  NEGATIVE   Bilirubin Urine NEGATIVE  NEGATIVE   Ketones, ur NEGATIVE  NEGATIVE mg/dL   Protein, ur 30 (*) NEGATIVE mg/dL   Urobilinogen, UA 0.2  0.0 - 1.0 mg/dL   Nitrite NEGATIVE  NEGATIVE   Leukocytes, UA NEGATIVE  NEGATIVE  URINE MICROSCOPIC-ADD ON   Collection Time    09/14/13  3:45 PM      Result Value Ref Range   Squamous Epithelial / LPF FEW (*) RARE   WBC, UA 0-2  <3 WBC/hpf   Urine-Other MUCOUS PRESENT      Imaging Studies:    Cervix 3.1 cm  Medications:  Scheduled . betamethasone acetate-betamethasone sodium phosphate  12 mg  Intramuscular Q24H  . docusate sodium  100 mg Oral Daily  . indomethacin  25 mg Oral 4 times per day  . prenatal multivitamin  1 tablet Oral Q1200   I have reviewed the patient's current medications.  ASSESSMENT: Patient Active Problem List   Diagnosis Date Noted  . Preterm uterine contractions, antepartum 09/14/2013    PLAN: Administer 2nd dose of betamethasone, ffn, and plan discharge home Will stop indocin before discharge.  LEGGETT,KELLY H. 09/15/2013,8:52 AM

## 2013-09-15 NOTE — Progress Notes (Addendum)
Fetus very active--unable to keep fetus on monitor continiously--per CNM ok to do monitoring 30 mins q shift--fetal movement felt very frequently

## 2013-09-16 MED ORDER — NIFEDIPINE ER OSMOTIC RELEASE 30 MG PO TB24: 30.0000 mg | ORAL_TABLET | Freq: Two times a day (BID) | ORAL | Status: DC | PRN

## 2013-09-16 MED ORDER — INDOMETHACIN 25 MG PO CAPS: 25.0000 mg | ORAL_CAPSULE | Freq: Four times a day (QID) | ORAL | Status: DC

## 2013-09-16 NOTE — Discharge Summary (Signed)
Antenatal Physician Discharge Summary  Patient ID: [name] MRN: [id number] DOB/AGE: 1991-04-02 22 y.o.  Admit date: 09/14/2013 Discharge date: 09/16/2013  Admission Diagnoses:  Preterm contractions  Discharge Diagnoses: The same  Prenatal Procedures: NST  Consults: Neonatology, Maternal Fetal Medicine  Significant Diagnostic Studies:  Results for orders placed during the hospital encounter of 09/14/13 (from the past 168 hour(s))  URINALYSIS, ROUTINE W REFLEX MICROSCOPIC   Collection Time    09/14/13  3:45 PM      Result Value Ref Range   Color, Urine YELLOW  YELLOW   APPearance CLEAR  CLEAR   Specific Gravity, Urine >1.030 (*) 1.005 - 1.030   pH 6.0  5.0 - 8.0   Glucose, UA NEGATIVE  NEGATIVE mg/dL   Hgb urine dipstick NEGATIVE  NEGATIVE   Bilirubin Urine NEGATIVE  NEGATIVE   Ketones, ur NEGATIVE  NEGATIVE mg/dL   Protein, ur 30 (*) NEGATIVE mg/dL   Urobilinogen, UA 0.2  0.0 - 1.0 mg/dL   Nitrite NEGATIVE  NEGATIVE   Leukocytes, UA NEGATIVE  NEGATIVE  URINE MICROSCOPIC-ADD ON   Collection Time    09/14/13  3:45 PM      Result Value Ref Range   Squamous Epithelial / LPF FEW (*) RARE   WBC, UA 0-2  <3 WBC/hpf   Urine-Other MUCOUS PRESENT    CULTURE, BETA STREP (GROUP B ONLY)   Collection Time    09/14/13  4:35 PM      Result Value Ref Range   Specimen Description VAGINAL/RECTAL     Special Requests Normal     Culture       Value: Culture reincubated for better growth     Performed at [facility]   Report Status PENDING     09/14/13  OB US Cervix 3.1 cm length, closed. Normal AFV, cephalic. Posterior placenta  Treatments: IV hydration, steroids: betamethasone and Procardia, Indocin  Hospital Course:  This is a 23 y.o. G1P0 with IUP at 4470w6d admitted for preterm contractions.  Cervical exam showed cervix to be closed, but patient's contractions were not managed by Procardia and she was started on Indocin which helped her contractions.   No leaking of  fluid and no bleeding.  She also received betamethasone x 2 doses.  She was observed, fetal heart rate monitoring remained reassuring, and she had no signs/symptoms of progressing preterm labor or other maternal-fetal concerns.  Her cervical exam was unchanged from admission.  She was deemed stable for discharge to home with outpatient follow up.  Discharge Exam: BP 109/71  Pulse 71  Temp(Src) 98.3 F (36.8 C) (Oral)  Resp 18  Ht 5\' 5"  (1.651 m)  Wt 116 lb (52.617 kg)  BMI 19.30 kg/m2  SpO2 100%  LMP 03/19/2013 General appearance: alert and no distress GI: soft, non-tender; bowel sounds normal; no masses,  no organomegaly Pelvic: Dilation: Closed  Effacement (%): Thick  Cervical Position: [place]: -3 Extremities: extremities normal, atraumatic, no cyanosis or edema and Homans sign is negative, no sign of DVT  Discharge Condition: Stable  Disposition: 01-Home or Self Care     Medication List         flintstones complete 60 MG chewable tablet  Chew 2 tablets by mouth daily.     indomethacin 25 MG capsule  Commonly known as:  INDOCIN  Take 1 capsule (25 mg total) by mouth every 6 (six) hours. For two days     NIFEdipine 30 MG 24 hr tablet  Commonly  known as:  PROCARDIA-XL/ADALAT-CC/NIFEDICAL-XL  Take 1 tablet (30 mg total) by mouth 2 (two) times daily as needed (CONTRACTIONS).           Follow-up Information   Follow up with [facility] On 09/29/2013. (As scheduled. Call clinic/come to MAU for postoperative issues)       Signed: Jaynie Collins A M.D. 09/16/2013, 6:45 AM

## 2013-09-16 NOTE — Discharge Instructions (Signed)
Preterm Labor Information °Preterm labor is when labor starts at less than 37 weeks of pregnancy. The normal length of a pregnancy is 39 to 41 weeks. °CAUSES °Often, there is no identifiable underlying cause as to why a woman goes into preterm labor. One of the most common known causes of preterm labor is infection. Infections of the uterus, cervix, vagina, amniotic sac, bladder, kidney, or even the lungs (pneumonia) can cause labor to start. Other suspected causes of preterm labor include:  °· Urogenital infections, such as yeast infections and bacterial vaginosis.   °· Uterine abnormalities (uterine shape, uterine septum, fibroids, or bleeding from the placenta).   °· A cervix that has been operated on (it may fail to stay closed).   °· Malformations in the fetus.   °· Multiple gestations (twins, triplets, and so on).   °· Breakage of the amniotic sac.   °RISK FACTORS °· Having a previous history of preterm labor.   °· Having premature rupture of membranes (PROM).   °· Having a placenta that covers the opening of the cervix (placenta previa).   °· Having a placenta that separates from the uterus (placental abruption).   °· Having a cervix that is too weak to hold the fetus in the uterus (incompetent cervix).   °· Having too much fluid in the amniotic sac (polyhydramnios).   °· Taking illegal drugs or smoking while pregnant.   °· Not gaining enough weight while pregnant.   °· Being younger than 18 and older than 24 years old.   °· Having a low socioeconomic status.   °· Being African American. °SYMPTOMS °Signs and symptoms of preterm labor include:  °· Menstrual-like cramps, abdominal pain, or back pain. °· Uterine contractions that are regular, as frequent as six in an hour, regardless of their intensity (may be mild or painful). °· Contractions that start on the top of the uterus and spread down to the lower abdomen and back.   °· A sense of increased pelvic pressure.   °· A watery or bloody mucus discharge that  comes from the vagina.   °TREATMENT °Depending on the length of the pregnancy and other circumstances, your health care provider may suggest bed rest. If necessary, there are medicines that can be given to stop contractions and to mature the fetal lungs. If labor happens before 34 weeks of pregnancy, a prolonged hospital stay may be recommended. Treatment depends on the condition of both you and the fetus.  °WHAT SHOULD YOU DO IF YOU THINK YOU ARE IN PRETERM LABOR? °Call your health care provider right away. You will need to go to the hospital to get checked immediately. °HOW CAN YOU PREVENT PRETERM LABOR IN FUTURE PREGNANCIES? °You should:  °· Stop smoking if you smoke.  °· Maintain healthy weight gain and avoid chemicals and drugs that are not necessary. °· Be watchful for any type of infection. °· Inform your health care provider if you have a known history of preterm labor. °Document Released: 08/24/2003 Document Revised: 02/03/2013 Document Reviewed: 07/06/2012 °ExitCare® Patient Information ©2014 [employer]. ° °Braxton Hicks Contractions °Pregnancy is commonly associated with contractions of the uterus throughout the pregnancy. Towards the end of pregnancy (32 to 34 weeks), these contractions (Braxton Hicks) can develop more often and may become more forceful. This is not true labor because these contractions do not result in opening (dilatation) and thinning of the cervix. They are sometimes difficult to tell apart from true labor because these contractions can be forceful and people have different pain tolerances. You should not feel embarrassed if you go to the hospital with false labor. Sometimes,   the only way to tell if you are in true labor is for your caregiver to follow the changes in the cervix. °How to tell the difference between true and false labor: °· False labor. °· The contractions of false labor are usually shorter, irregular and not as hard as those of true labor. °· They are often felt in  the front of the lower abdomen and in the groin. °· They may leave with walking around or changing positions while lying down. °· They get weaker and are shorter lasting as time goes on. °· These contractions are usually irregular. °· They do not usually become progressively stronger, regular and closer together as with true labor. °· True labor. °· Contractions in true labor last 30 to 70 seconds, become very regular, usually become more intense, and increase in frequency. °· They do not go away with walking. °· The discomfort is usually felt in the top of the uterus and spreads to the lower abdomen and low back. °· True labor can be determined by your caregiver with an exam. This will show that the cervix is dilating and getting thinner. °If there are no prenatal problems or other health problems associated with the pregnancy, it is completely safe to be sent home with false labor and await the onset of true labor. °HOME CARE INSTRUCTIONS  °· Keep up with your usual exercises and instructions. °· Take medications as directed. °· Keep your regular prenatal appointment. °· Eat and drink lightly if you think you are going into labor. °· If BH contractions are making you uncomfortable: °· Change your activity position from lying down or resting to walking/walking to resting. °· Sit and rest in a tub of warm water. °· Drink 2 to 3 glasses of water. Dehydration may cause B-H contractions. °· Do slow and deep breathing several times an hour. °SEEK IMMEDIATE MEDICAL CARE IF:  °· Your contractions continue to become stronger, more regular, and closer together. °· You have a gushing, burst or leaking of fluid from the vagina. °· An oral temperature above 102° F (38.9° C) develops. °· You have passage of blood-tinged mucus. °· You develop vaginal bleeding. °· You develop continuous belly (abdominal) pain. °· You have low back pain that you never had before. °· You feel the baby's head pushing down causing pelvic  pressure. °· The baby is not moving as much as it used to. °Document Released: 06/03/2005 Document Revised: 08/26/2011 Document Reviewed: 03/15/2013 °ExitCare® Patient Information ©2014 [employer]. ° ° ° °Fetal Movement Counts °Patient Name: __________________________________________________ Patient Due Date: ____________________ °Performing a fetal movement count is highly recommended in high-risk pregnancies, but it is good for every pregnant woman to do. Your caregiver may ask you to start counting fetal movements at 28 weeks of the pregnancy. Fetal movements often increase: °· After eating a full meal. °· After physical activity. °· After eating or drinking something sweet or cold. °· At rest. °Pay attention to when you feel the baby is most active. This will help you notice a pattern of your baby's sleep and wake cycles and what factors contribute to an increase in fetal movement. It is important to perform a fetal movement count at the same time each day when your baby is normally most active.  °HOW TO COUNT FETAL MOVEMENTS °1. Find a quiet and comfortable area to sit or lie down on your left side. Lying on your left side provides the best blood and oxygen circulation to your baby. °2. Write   down the day and time on a sheet of paper or in a journal. °3. Start counting kicks, flutters, swishes, rolls, or jabs in a 2 hour period. You should feel at least 10 movements within 2 hours. °4. If you do not feel 10 movements in 2 hours, wait 2 3 hours and count again. Look for a change in the pattern or not enough counts in 2 hours. °SEEK MEDICAL CARE IF: °· You feel less than 10 counts in 2 hours, tried twice. °· There is no movement in over an hour. °· The pattern is changing or taking longer each day to reach 10 counts in 2 hours. °· You feel the baby is not moving as he or she usually does. °Date: ____________ Movements: ____________ Start time: ____________ Finish time: ____________  °Date: ____________  Movements: ____________ Start time: ____________ Finish time: ____________ °Date: ____________ Movements: ____________ Start time: ____________ Finish time: ____________ °Date: ____________ Movements: ____________ Start time: ____________ Finish time: ____________ °Date: ____________ Movements: ____________ Start time: ____________ Finish time: ____________ °Date: ____________ Movements: ____________ Start time: ____________ Finish time: ____________ °Date: ____________ Movements: ____________ Start time: ____________ Finish time: ____________ °Date: ____________ Movements: ____________ Start time: ____________ Finish time: ____________  °Date: ____________ Movements: ____________ Start time: ____________ Finish time: ____________ °Date: ____________ Movements: ____________ Start time: ____________ Finish time: ____________ °Date: ____________ Movements: ____________ Start time: ____________ Finish time: ____________ °Date: ____________ Movements: ____________ Start time: ____________ Finish time: ____________ °Date: ____________ Movements: ____________ Start time: ____________ Finish time: ____________ °Date: ____________ Movements: ____________ Start time: ____________ Finish time: ____________ °Date: ____________ Movements: ____________ Start time: ____________ Finish time: ____________  °Date: ____________ Movements: ____________ Start time: ____________ Finish time: ____________ °Date: ____________ Movements: ____________ Start time: ____________ Finish time: ____________ °Date: ____________ Movements: ____________ Start time: ____________ Finish time: ____________ °Date: ____________ Movements: ____________ Start time: ____________ Finish time: ____________ °Date: ____________ Movements: ____________ Start time: ____________ Finish time: ____________ °Date: ____________ Movements: ____________ Start time: ____________ Finish time: ____________ °Date: ____________ Movements: ____________ Start time:  ____________ Finish time: ____________  °Date: ____________ Movements: ____________ Start time: ____________ Finish time: ____________ °Date: ____________ Movements: ____________ Start time: ____________ Finish time: ____________ °Date: ____________ Movements: ____________ Start time: ____________ Finish time: ____________ °Date: ____________ Movements: ____________ Start time: ____________ Finish time: ____________ °Date: ____________ Movements: ____________ Start time: ____________ Finish time: ____________ °Date: ____________ Movements: ____________ Start time: ____________ Finish time: ____________ °Date: ____________ Movements: ____________ Start time: ____________ Finish time: ____________  °Date: ____________ Movements: ____________ Start time: ____________ Finish time: ____________ °Date: ____________ Movements: ____________ Start time: ____________ Finish time: ____________ °Date: ____________ Movements: ____________ Start time: ____________ Finish time: ____________ °Date: ____________ Movements: ____________ Start time: ____________ Finish time: ____________ °Date: ____________ Movements: ____________ Start time: ____________ Finish time: ____________ °Date: ____________ Movements: ____________ Start time: ____________ Finish time: ____________ °Date: ____________ Movements: ____________ Start time: ____________ Finish time: ____________  °Date: ____________ Movements: ____________ Start time: ____________ Finish time: ____________ °Date: ____________ Movements: ____________ Start time: ____________ Finish time: ____________ °Date: ____________ Movements: ____________ Start time: ____________ Finish time: ____________ °Date: ____________ Movements: ____________ Start time: ____________ Finish time: ____________ °Date: ____________ Movements: ____________ Start time: ____________ Finish time: ____________ °Date: ____________ Movements: ____________ Start time: ____________ Finish time: ____________ °Date:  ____________ Movements: ____________ Start time: ____________ Finish time: ____________  °Date: ____________ Movements: ____________ Start time: ____________ Finish time: ____________ °Date: ____________ Movements: ____________ Start time: ____________ Finish time: ____________ °Date: ____________ Movements: ____________ Start time: ____________ Finish time: ____________ °Date: ____________ Movements: ____________   Start time: ____________ Finish time: ____________ °Date: ____________ Movements: ____________ Start time: ____________ Finish time: ____________ °Date: ____________ Movements: ____________ Start time: ____________ Finish time: ____________ °Date: ____________ Movements: ____________ Start time: ____________ Finish time: ____________  °Date: ____________ Movements: ____________ Start time: ____________ Finish time: ____________ °Date: ____________ Movements: ____________ Start time: ____________ Finish time: ____________ °Date: ____________ Movements: ____________ Start time: ____________ Finish time: ____________ °Date: ____________ Movements: ____________ Start time: ____________ Finish time: ____________ °Date: ____________ Movements: ____________ Start time: ____________ Finish time: ____________ °Date: ____________ Movements: ____________ Start time: ____________ Finish time: ____________ °Document Released: 07/03/2006 Document Revised: 05/20/2012 Document Reviewed: 03/30/2012 °Exit[employer]® Patient Information ©2014 [employer]. ° °

## 2013-09-16 NOTE — Progress Notes (Signed)
Pt d/c home--pt given discharge instructions and instructions as well as prescriptions reviewed--pt informed to keep current scheduled appt with OB--pt questions answered and reports no further questions

## 2013-09-17 ENCOUNTER — Encounter: Payer: Self-pay | Admitting: Advanced Practice Midwife

## 2013-09-17 LAB — CULTURE, BETA STREP (GROUP B ONLY): Special Requests: NORMAL

## 2013-09-28 NOTE — H&P (Signed)
Attestation of Attending Supervision of Advanced Practitioner (CNM/NP): Evaluation and management procedures were performed by the Advanced Practitioner under my supervision and collaboration. I have reviewed the Advanced Practitioner's note and chart, and I agree with the management and plan.  Lesly DukesKelly H Shalamar Plourde 7:14 PM

## 2013-09-28 NOTE — MAU Provider Note (Signed)
Attestation of Attending Supervision of Advanced Practitioner (CNM/NP): Evaluation and management procedures were performed by the Advanced Practitioner under my supervision and collaboration. I have reviewed the Advanced Practitioner's note and chart, and I agree with the management and plan.  Jannel Lynne H Abbee Cremeens 7:14 PM   

## 2013-09-29 ENCOUNTER — Other Ambulatory Visit (HOSPITAL_COMMUNITY): Payer: Self-pay | Admitting: Nurse Practitioner

## 2013-09-29 DIAGNOSIS — O365990 Maternal care for other known or suspected poor fetal growth, unspecified trimester, not applicable or unspecified: Secondary | ICD-10-CM

## 2013-10-06 ENCOUNTER — Ambulatory Visit (HOSPITAL_COMMUNITY)
Admission: RE | Admit: 2013-10-06 | Discharge: 2013-10-06 | Disposition: A | Discharge status: Home / Self Care | Payer: Medicaid Other | Source: Ambulatory Visit | Attending: Nurse Practitioner | Admitting: Nurse Practitioner

## 2013-10-06 DIAGNOSIS — Z3689 Encounter for other specified antenatal screening: Secondary | ICD-10-CM | POA: Insufficient documentation

## 2013-10-06 DIAGNOSIS — O365990 Maternal care for other known or suspected poor fetal growth, unspecified trimester, not applicable or unspecified: Secondary | ICD-10-CM | POA: Insufficient documentation

## 2013-10-08 ENCOUNTER — Other Ambulatory Visit (HOSPITAL_COMMUNITY): Payer: Self-pay | Admitting: Nurse Practitioner

## 2013-10-08 DIAGNOSIS — Z0374 Encounter for suspected problem with fetal growth ruled out: Secondary | ICD-10-CM

## 2013-10-08 DIAGNOSIS — Z1389 Encounter for screening for other disorder: Secondary | ICD-10-CM

## 2013-10-11 LAB — OB RESULTS CONSOLE RPR: RPR: NONREACTIVE

## 2013-10-11 LAB — GLUCOSE TOLERANCE, 1 HOUR (50G) W/O FASTING: GLUCOSE: 142

## 2013-10-11 LAB — OB RESULTS CONSOLE HGB/HCT, BLOOD
HEMATOCRIT: 39 %
Hemoglobin: 12.8 g/dL

## 2013-10-18 LAB — GLUCOSE, FASTING GESTATIONAL
GLUCOSE FASTING: 66 mg/dL (ref 60–109)
GLUCOSE: 154
Glucose: 117
Glucose: 159

## 2013-10-27 ENCOUNTER — Ambulatory Visit (HOSPITAL_COMMUNITY)
Admission: RE | Admit: 2013-10-27 | Discharge: 2013-10-27 | Disposition: A | Discharge status: Home / Self Care | Payer: Medicaid Other | Source: Ambulatory Visit | Attending: Nurse Practitioner | Admitting: Nurse Practitioner

## 2013-10-27 ENCOUNTER — Other Ambulatory Visit (HOSPITAL_COMMUNITY): Payer: Self-pay | Admitting: Nurse Practitioner

## 2013-10-27 DIAGNOSIS — Z1389 Encounter for screening for other disorder: Secondary | ICD-10-CM

## 2013-10-27 DIAGNOSIS — Z363 Encounter for antenatal screening for malformations: Secondary | ICD-10-CM | POA: Insufficient documentation

## 2013-10-27 DIAGNOSIS — Z0374 Encounter for suspected problem with fetal growth ruled out: Secondary | ICD-10-CM

## 2013-10-29 ENCOUNTER — Other Ambulatory Visit (HOSPITAL_COMMUNITY): Payer: Self-pay | Admitting: Nurse Practitioner

## 2013-10-29 DIAGNOSIS — Z0374 Encounter for suspected problem with fetal growth ruled out: Secondary | ICD-10-CM

## 2013-11-17 ENCOUNTER — Encounter (HOSPITAL_COMMUNITY): Payer: Self-pay

## 2013-11-17 ENCOUNTER — Other Ambulatory Visit (HOSPITAL_COMMUNITY): Payer: Self-pay | Admitting: Maternal and Fetal Medicine

## 2013-11-17 ENCOUNTER — Ambulatory Visit (HOSPITAL_COMMUNITY): Payer: Medicaid Other

## 2013-11-17 ENCOUNTER — Ambulatory Visit (HOSPITAL_COMMUNITY)
Admission: RE | Admit: 2013-11-17 | Discharge: 2013-11-17 | Disposition: A | Discharge status: Home / Self Care | Payer: Medicaid Other | Source: Ambulatory Visit | Attending: Obstetrics & Gynecology | Admitting: Obstetrics & Gynecology

## 2013-11-17 ENCOUNTER — Other Ambulatory Visit (HOSPITAL_COMMUNITY): Payer: Self-pay | Admitting: Nurse Practitioner

## 2013-11-17 VITALS — BP 132/81 | HR 98 | Wt 132.0 lb

## 2013-11-17 DIAGNOSIS — Z0374 Encounter for suspected problem with fetal growth ruled out: Secondary | ICD-10-CM

## 2013-11-17 DIAGNOSIS — Z3689 Encounter for other specified antenatal screening: Secondary | ICD-10-CM | POA: Insufficient documentation

## 2013-11-18 ENCOUNTER — Inpatient Hospital Stay (HOSPITAL_COMMUNITY)
Admission: AD | Admit: 2013-11-18 | Discharge: 2013-11-19 | Disposition: A | Discharge status: Home / Self Care | Payer: Medicaid Other | Source: Ambulatory Visit | Attending: Obstetrics and Gynecology | Admitting: Obstetrics and Gynecology

## 2013-11-18 ENCOUNTER — Encounter (HOSPITAL_COMMUNITY): Payer: Self-pay | Admitting: *Deleted

## 2013-11-18 DIAGNOSIS — O368190 Decreased fetal movements, unspecified trimester, not applicable or unspecified: Secondary | ICD-10-CM | POA: Insufficient documentation

## 2013-11-18 DIAGNOSIS — O368130 Decreased fetal movements, third trimester, not applicable or unspecified: Secondary | ICD-10-CM

## 2013-11-18 NOTE — MAU Note (Signed)
Pt states abdomen felt really soft and mushy and empty and it is usually hard and the baby is very active at this time of the night

## 2013-11-19 DIAGNOSIS — O368190 Decreased fetal movements, unspecified trimester, not applicable or unspecified: Secondary | ICD-10-CM

## 2013-11-19 LAB — URINALYSIS, ROUTINE W REFLEX MICROSCOPIC
Bilirubin Urine: NEGATIVE
GLUCOSE, UA: NEGATIVE mg/dL
HGB URINE DIPSTICK: NEGATIVE
KETONES UR: NEGATIVE mg/dL
Leukocytes, UA: NEGATIVE
Nitrite: NEGATIVE
PROTEIN: NEGATIVE mg/dL
Specific Gravity, Urine: <1.005 — ABNORMAL LOW (ref 1.005–1.030)
Urobilinogen, UA: 0.2 mg/dL (ref 0.0–1.0)
pH: 6 (ref 5.0–8.0)

## 2013-11-19 NOTE — MAU Provider Note (Signed)
None     Chief Complaint:  Decreased Fetal Movement   [name] is  23 y.o. G1P0 at [redacted]w[redacted]d presents complaining of Decreased Fetal Movement  Obstetrical/Gynecological History: OB History   Grav Para Term Preterm Abortions TAB SAB Ect Mult Living   1              Past Medical History: Past Medical History  Diagnosis Date  . Medical history non-contributory     Past Surgical History: Past Surgical History  Procedure Laterality Date  . No past surgeries      Family History: Family History  Problem Relation Age of Onset  . Hypertension Mother   . Diabetes Maternal Grandmother     Social History: History  Substance Use Topics  . Smoking status: Never Smoker   . Smokeless tobacco: Never Used  . Alcohol Use: No    Allergies: No Known Allergies  Meds:  Prescriptions prior to admission  Medication Sig Dispense Refill  . flintstones complete (FLINTSTONES) 60 MG chewable tablet Chew 2 tablets by mouth daily.      . indomethacin (INDOCIN) 25 MG capsule Take 1 capsule (25 mg total) by mouth every 6 (six) hours. For two days  10 capsule  1  . NIFEdipine (PROCARDIA-XL/ADALAT-CC/NIFEDICAL-XL) 30 MG 24 hr tablet Take 1 tablet (30 mg total) by mouth 2 (two) times daily as needed (CONTRACTIONS).  60 tablet  2    .Review of Systems   Constitutional: Negative for fever and chills Eyes: Negative for visual disturbances Respiratory: Negative for shortness of breath, dyspnea Cardiovascular: Negative for chest pain or palpitations  Gastrointestinal: Negative for vomiting, diarrhea and constipation Genitourinary: Negative for dysuria and urgency Musculoskeletal: Negative for back pain, joint pain, myalgias  Neurological: Negative for dizziness and headaches    Physical Exam  Blood pressure 102/75, pulse 74, temperature 98.1 F (36.7 C), temperature source Oral, resp. rate 16, last menstrual period 03/19/2013. GENERAL: Well-developed, well-nourished female in no acute  distress.  LUNGS: Clear to auscultation bilaterally.  HEART: Regular rate and rhythm. ABDOMEN: Soft, nontender, nondistended, gravid.  EXTREMITIES: Nontender, no edema, 2+ distal pulses. DTR's 2+ FHT:  Baseline rate 140 bpm   Variability moderate  Accelerations present   Decelerations none Contractions: Every 0 mins    Assessment: [name] is  23 y.o. G1P0 at [redacted]w[redacted]d presents with decreased fetal movement, resolved in MAU.  Reactive NST.  Plan: D/c home with kick counts  Jacklyn Shell 6/5/201512:06 AM

## 2013-11-19 NOTE — Discharge Instructions (Signed)
Fetal Movement Counts Patient Name: __________________________________________________ Patient Due Date: ____________________ Performing a fetal movement count is highly recommended in high-risk pregnancies, but it is good for every pregnant woman to do. Your caregiver may ask you to start counting fetal movements at 28 weeks of the pregnancy. Fetal movements often increase:  After eating a full meal.  After physical activity.  After eating or drinking something sweet or cold.  At rest. Pay attention to when you feel the baby is most active. This will help you notice a pattern of your baby's sleep and wake cycles and what factors contribute to an increase in fetal movement. It is important to perform a fetal movement count at the same time each day when your baby is normally most active.  HOW TO COUNT FETAL MOVEMENTS 1. Find a quiet and comfortable area to sit or lie down on your left side. Lying on your left side provides the best blood and oxygen circulation to your baby. 2. Write down the day and time on a sheet of paper or in a journal. 3. Start counting kicks, flutters, swishes, rolls, or jabs in a 2 hour period. You should feel at least 10 movements within 2 hours. 4. If you do not feel 10 movements in 2 hours, wait 2 3 hours and count again. Look for a change in the pattern or not enough counts in 2 hours. SEEK MEDICAL CARE IF:  You feel less than 10 counts in 2 hours, tried twice.  There is no movement in over an hour.  The pattern is changing or taking longer each day to reach 10 counts in 2 hours.  You feel the baby is not moving as he or she usually does. Date: ____________ Movements: ____________ Start time: ____________ Finish time: ____________  Date: ____________ Movements: ____________ Start time: ____________ Finish time: ____________ Date: ____________ Movements: ____________ Start time: ____________ Finish time: ____________ Date: ____________ Movements: ____________  Start time: ____________ Finish time: ____________ Date: ____________ Movements: ____________ Start time: ____________ Finish time: ____________ Date: ____________ Movements: ____________ Start time: ____________ Finish time: ____________ Date: ____________ Movements: ____________ Start time: ____________ Finish time: ____________ Date: ____________ Movements: ____________ Start time: ____________ Finish time: ____________  Date: ____________ Movements: ____________ Start time: ____________ Finish time: ____________ Date: ____________ Movements: ____________ Start time: ____________ Finish time: ____________ Date: ____________ Movements: ____________ Start time: ____________ Finish time: ____________ Date: ____________ Movements: ____________ Start time: ____________ Finish time: ____________ Date: ____________ Movements: ____________ Start time: ____________ Finish time: ____________ Date: ____________ Movements: ____________ Start time: ____________ Finish time: ____________ Date: ____________ Movements: ____________ Start time: ____________ Finish time: ____________  Date: ____________ Movements: ____________ Start time: ____________ Finish time: ____________ Date: ____________ Movements: ____________ Start time: ____________ Finish time: ____________ Date: ____________ Movements: ____________ Start time: ____________ Finish time: ____________ Date: ____________ Movements: ____________ Start time: ____________ Finish time: ____________ Date: ____________ Movements: ____________ Start time: ____________ Finish time: ____________ Date: ____________ Movements: ____________ Start time: ____________ Finish time: ____________ Date: ____________ Movements: ____________ Start time: ____________ Finish time: ____________  Date: ____________ Movements: ____________ Start time: ____________ Finish time: ____________ Date: ____________ Movements: ____________ Start time: ____________ Finish time:  ____________ Date: ____________ Movements: ____________ Start time: ____________ Finish time: ____________ Date: ____________ Movements: ____________ Start time: ____________ Finish time: ____________ Date: ____________ Movements: ____________ Start time: ____________ Finish time: ____________ Date: ____________ Movements: ____________ Start time: ____________ Finish time: ____________ Date: ____________ Movements: ____________ Start time: ____________ Finish time: ____________  Date: ____________ Movements: ____________ Start time: ____________ Finish   time: ____________ Date: ____________ Movements: ____________ Start time: ____________ Finish time: ____________ Date: ____________ Movements: ____________ Start time: ____________ Finish time: ____________ Date: ____________ Movements: ____________ Start time: ____________ Finish time: ____________ Date: ____________ Movements: ____________ Start time: ____________ Finish time: ____________ Date: ____________ Movements: ____________ Start time: ____________ Finish time: ____________ Date: ____________ Movements: ____________ Start time: ____________ Finish time: ____________  Date: ____________ Movements: ____________ Start time: ____________ Finish time: ____________ Date: ____________ Movements: ____________ Start time: ____________ Finish time: ____________ Date: ____________ Movements: ____________ Start time: ____________ Finish time: ____________ Date: ____________ Movements: ____________ Start time: ____________ Finish time: ____________ Date: ____________ Movements: ____________ Start time: ____________ Finish time: ____________ Date: ____________ Movements: ____________ Start time: ____________ Finish time: ____________ Date: ____________ Movements: ____________ Start time: ____________ Finish time: ____________  Date: ____________ Movements: ____________ Start time: ____________ Finish time: ____________ Date: ____________ Movements:  ____________ Start time: ____________ Finish time: ____________ Date: ____________ Movements: ____________ Start time: ____________ Finish time: ____________ Date: ____________ Movements: ____________ Start time: ____________ Finish time: ____________ Date: ____________ Movements: ____________ Start time: ____________ Finish time: ____________ Date: ____________ Movements: ____________ Start time: ____________ Finish time: ____________ Date: ____________ Movements: ____________ Start time: ____________ Finish time: ____________  Date: ____________ Movements: ____________ Start time: ____________ Finish time: ____________ Date: ____________ Movements: ____________ Start time: ____________ Finish time: ____________ Date: ____________ Movements: ____________ Start time: ____________ Finish time: ____________ Date: ____________ Movements: ____________ Start time: ____________ Finish time: ____________ Date: ____________ Movements: ____________ Start time: ____________ Finish time: ____________ Date: ____________ Movements: ____________ Start time: ____________ Finish time: ____________ Document Released: 07/03/2006 Document Revised: 05/20/2012 Document Reviewed: 03/30/2012 [employer] Patient Information 2014 [employer].  

## 2013-11-22 NOTE — MAU Provider Note (Signed)
Attestation of Attending Supervision of Advanced Practitioner (CNM/NP): Evaluation and management procedures were performed by the Advanced Practitioner under my supervision and collaboration.  I have reviewed the Advanced Practitioner's note and chart, and I agree with the management and plan.  Jaclyn Carew 11/22/2013 8:09 AM   

## 2013-11-23 ENCOUNTER — Ambulatory Visit (HOSPITAL_COMMUNITY)
Admission: RE | Admit: 2013-11-23 | Discharge: 2013-11-23 | Disposition: A | Discharge status: Home / Self Care | Payer: Medicaid Other | Source: Ambulatory Visit | Attending: Obstetrics & Gynecology | Admitting: Obstetrics & Gynecology

## 2013-11-23 ENCOUNTER — Encounter (HOSPITAL_COMMUNITY): Payer: Self-pay

## 2013-11-23 DIAGNOSIS — O365990 Maternal care for other known or suspected poor fetal growth, unspecified trimester, not applicable or unspecified: Secondary | ICD-10-CM | POA: Insufficient documentation

## 2013-11-23 DIAGNOSIS — O358XX0 Maternal care for other (suspected) fetal abnormality and damage, not applicable or unspecified: Secondary | ICD-10-CM | POA: Insufficient documentation

## 2013-11-26 ENCOUNTER — Ambulatory Visit (HOSPITAL_COMMUNITY)
Admission: RE | Admit: 2013-11-26 | Discharge: 2013-11-26 | Disposition: A | Discharge status: Home / Self Care | Payer: Medicaid Other | Source: Ambulatory Visit | Attending: Obstetrics & Gynecology | Admitting: Obstetrics & Gynecology

## 2013-11-26 ENCOUNTER — Other Ambulatory Visit (HOSPITAL_COMMUNITY): Payer: Self-pay | Admitting: Maternal and Fetal Medicine

## 2013-11-26 ENCOUNTER — Encounter (HOSPITAL_COMMUNITY): Payer: Self-pay

## 2013-11-26 DIAGNOSIS — Z0374 Encounter for suspected problem with fetal growth ruled out: Secondary | ICD-10-CM

## 2013-11-26 DIAGNOSIS — O365990 Maternal care for other known or suspected poor fetal growth, unspecified trimester, not applicable or unspecified: Secondary | ICD-10-CM | POA: Insufficient documentation

## 2013-11-28 LAB — OB RESULTS CONSOLE GBS: GBS: NEGATIVE

## 2013-11-30 ENCOUNTER — Ambulatory Visit (HOSPITAL_COMMUNITY)
Admission: RE | Admit: 2013-11-30 | Discharge: 2013-11-30 | Disposition: A | Discharge status: Home / Self Care | Payer: Medicaid Other | Source: Ambulatory Visit | Attending: Obstetrics & Gynecology | Admitting: Obstetrics & Gynecology

## 2013-11-30 ENCOUNTER — Encounter (HOSPITAL_COMMUNITY): Payer: Self-pay

## 2013-11-30 DIAGNOSIS — O365990 Maternal care for other known or suspected poor fetal growth, unspecified trimester, not applicable or unspecified: Secondary | ICD-10-CM | POA: Insufficient documentation

## 2013-12-02 ENCOUNTER — Ambulatory Visit (INDEPENDENT_AMBULATORY_CARE_PROVIDER_SITE_OTHER): Payer: Medicaid Other | Admitting: Obstetrics and Gynecology

## 2013-12-02 ENCOUNTER — Encounter: Payer: Self-pay | Admitting: Obstetrics and Gynecology

## 2013-12-02 VITALS — BP 124/81 | HR 82 | Temp 98.8°F | Wt 135.6 lb

## 2013-12-02 DIAGNOSIS — Z3400 Encounter for supervision of normal first pregnancy, unspecified trimester: Secondary | ICD-10-CM | POA: Insufficient documentation

## 2013-12-02 DIAGNOSIS — Z283 Underimmunization status: Secondary | ICD-10-CM

## 2013-12-02 DIAGNOSIS — O210 Mild hyperemesis gravidarum: Secondary | ICD-10-CM

## 2013-12-02 DIAGNOSIS — O4700 False labor before 37 completed weeks of gestation, unspecified trimester: Secondary | ICD-10-CM

## 2013-12-02 DIAGNOSIS — Z361 Encounter for antenatal screening for raised alphafetoprotein level: Secondary | ICD-10-CM | POA: Insufficient documentation

## 2013-12-02 DIAGNOSIS — O09899 Supervision of other high risk pregnancies, unspecified trimester: Secondary | ICD-10-CM | POA: Insufficient documentation

## 2013-12-02 DIAGNOSIS — Z2839 Other underimmunization status: Secondary | ICD-10-CM | POA: Insufficient documentation

## 2013-12-02 LAB — POCT URINALYSIS DIP (DEVICE)
Bilirubin Urine: NEGATIVE
Glucose, UA: 500 mg/dL — AB
HGB URINE DIPSTICK: NEGATIVE
Ketones, ur: NEGATIVE mg/dL
Leukocytes, UA: NEGATIVE
Nitrite: NEGATIVE
PROTEIN: 100 mg/dL — AB
Urobilinogen, UA: 0.2 mg/dL (ref 0.0–1.0)
pH: 6.5 (ref 5.0–8.0)

## 2013-12-02 NOTE — Progress Notes (Signed)
Nutrition note: 1st visit consult Pt has gained 24.6# @ 8247w6d, which is wnl.  Pt reports eating 4 meals & 2 snacks/d. Pt is drinking 2 Ensure/d. Pt is taking 2 flintstone vitamins. Pt reports no N/V but has heartburn occ. NKFA. Pt received verbal & written education on general nutrition during pregnancy. Discussed wt gain goals of 25-35# or 1#/wk. Pt agrees to continue taking her vitamins and drinking Ensure. Pt has WIC & plans to BF. F/u as needed [name] RevealLaura Smith, MS, RD, LDN, [facility]

## 2013-12-02 NOTE — Progress Notes (Signed)
New ob packet/28 week packet given Weight gain 25-[facility]lbs [facility]

## 2013-12-02 NOTE — Progress Notes (Signed)
Patient is transferring care from [facility] department secondary to [facility]. She is scheduled for twice weekly NST with weekly dopplers with MFM. Next ultrasound scheduled for 6/19. [facility] will call for results. FM/labor precautions reviewed.

## 2013-12-03 ENCOUNTER — Ambulatory Visit (HOSPITAL_COMMUNITY)
Admission: RE | Admit: 2013-12-03 | Discharge: 2013-12-03 | Disposition: A | Discharge status: Home / Self Care | Payer: Medicaid Other | Source: Ambulatory Visit | Attending: Obstetrics & Gynecology | Admitting: Obstetrics & Gynecology

## 2013-12-03 ENCOUNTER — Encounter (HOSPITAL_COMMUNITY): Payer: Self-pay

## 2013-12-03 ENCOUNTER — Other Ambulatory Visit (HOSPITAL_COMMUNITY): Payer: Self-pay | Admitting: Maternal and Fetal Medicine

## 2013-12-03 DIAGNOSIS — Z0374 Encounter for suspected problem with fetal growth ruled out: Secondary | ICD-10-CM

## 2013-12-03 DIAGNOSIS — O365990 Maternal care for other known or suspected poor fetal growth, unspecified trimester, not applicable or unspecified: Secondary | ICD-10-CM | POA: Insufficient documentation

## 2013-12-07 ENCOUNTER — Ambulatory Visit (HOSPITAL_COMMUNITY)
Admission: RE | Admit: 2013-12-07 | Discharge: 2013-12-07 | Disposition: A | Discharge status: Home / Self Care | Payer: Medicaid Other | Source: Ambulatory Visit | Attending: Obstetrics & Gynecology | Admitting: Obstetrics & Gynecology

## 2013-12-07 ENCOUNTER — Encounter (HOSPITAL_COMMUNITY): Payer: Self-pay

## 2013-12-07 DIAGNOSIS — O210 Mild hyperemesis gravidarum: Secondary | ICD-10-CM | POA: Insufficient documentation

## 2013-12-07 DIAGNOSIS — O365990 Maternal care for other known or suspected poor fetal growth, unspecified trimester, not applicable or unspecified: Secondary | ICD-10-CM | POA: Insufficient documentation

## 2013-12-10 ENCOUNTER — Other Ambulatory Visit (HOSPITAL_COMMUNITY): Payer: Self-pay | Admitting: Maternal and Fetal Medicine

## 2013-12-10 ENCOUNTER — Ambulatory Visit (HOSPITAL_COMMUNITY): Admission: RE | Admit: 2013-12-10 | Payer: Medicaid Other | Source: Ambulatory Visit

## 2013-12-10 ENCOUNTER — Ambulatory Visit (HOSPITAL_COMMUNITY)
Admission: RE | Admit: 2013-12-10 | Discharge: 2013-12-10 | Disposition: A | Discharge status: Home / Self Care | Payer: Medicaid Other | Source: Ambulatory Visit | Attending: Obstetrics & Gynecology | Admitting: Obstetrics & Gynecology

## 2013-12-10 DIAGNOSIS — O365990 Maternal care for other known or suspected poor fetal growth, unspecified trimester, not applicable or unspecified: Secondary | ICD-10-CM | POA: Insufficient documentation

## 2013-12-10 DIAGNOSIS — Z3689 Encounter for other specified antenatal screening: Secondary | ICD-10-CM | POA: Insufficient documentation

## 2013-12-12 ENCOUNTER — Inpatient Hospital Stay (HOSPITAL_COMMUNITY)
Admission: AD | Admit: 2013-12-12 | Discharge: 2013-12-14 | DRG: 775 | Disposition: A | Discharge status: Home / Self Care | Payer: Medicaid Other | Source: Ambulatory Visit | Attending: Obstetrics & Gynecology | Admitting: Obstetrics & Gynecology

## 2013-12-12 ENCOUNTER — Inpatient Hospital Stay (HOSPITAL_COMMUNITY): Payer: Medicaid Other | Admitting: Anesthesiology

## 2013-12-12 ENCOUNTER — Encounter (HOSPITAL_COMMUNITY): Payer: Medicaid Other | Admitting: Anesthesiology

## 2013-12-12 ENCOUNTER — Encounter (HOSPITAL_COMMUNITY): Payer: Self-pay | Admitting: *Deleted

## 2013-12-12 DIAGNOSIS — Z8249 Family history of ischemic heart disease and other diseases of the circulatory system: Secondary | ICD-10-CM

## 2013-12-12 DIAGNOSIS — F1210 Cannabis abuse, uncomplicated: Secondary | ICD-10-CM | POA: Diagnosis present

## 2013-12-12 DIAGNOSIS — O4290 Premature rupture of membranes, unspecified as to length of time between rupture and onset of labor, unspecified weeks of gestation: Principal | ICD-10-CM | POA: Diagnosis present

## 2013-12-12 DIAGNOSIS — O99344 Other mental disorders complicating childbirth: Secondary | ICD-10-CM | POA: Diagnosis present

## 2013-12-12 DIAGNOSIS — Z3400 Encounter for supervision of normal first pregnancy, unspecified trimester: Secondary | ICD-10-CM

## 2013-12-12 DIAGNOSIS — Z833 Family history of diabetes mellitus: Secondary | ICD-10-CM

## 2013-12-12 DIAGNOSIS — O365990 Maternal care for other known or suspected poor fetal growth, unspecified trimester, not applicable or unspecified: Secondary | ICD-10-CM

## 2013-12-12 DIAGNOSIS — Z3490 Encounter for supervision of normal pregnancy, unspecified, unspecified trimester: Secondary | ICD-10-CM

## 2013-12-12 LAB — CBC
HEMATOCRIT: 42 % (ref 36.0–46.0)
Hemoglobin: 14.3 g/dL (ref 12.0–15.0)
MCH: 28.5 pg (ref 26.0–34.0)
MCHC: 34 g/dL (ref 30.0–36.0)
MCV: 83.8 fL (ref 78.0–100.0)
Platelets: 237 10*3/uL (ref 150–400)
RBC: 5.01 MIL/uL (ref 3.87–5.11)
RDW: 14.1 % (ref 11.5–15.5)
WBC: 9.3 10*3/uL (ref 4.0–10.5)

## 2013-12-12 LAB — POCT FERN TEST

## 2013-12-12 LAB — RPR

## 2013-12-12 MED ORDER — CITRIC ACID-SODIUM CITRATE 334-500 MG/5ML PO SOLN: 30.0000 mL | ORAL | Status: DC | PRN

## 2013-12-12 MED ORDER — FLEET ENEMA 7-19 GM/118ML RE ENEM: 1.0000 | ENEMA | RECTAL | Status: DC | PRN

## 2013-12-12 MED ORDER — LACTATED RINGERS IV SOLN: 500.0000 mL | INTRAVENOUS | Status: DC | PRN

## 2013-12-12 MED ORDER — PHENYLEPHRINE 40 MCG/ML (10ML) SYRINGE FOR IV PUSH (FOR BLOOD PRESSURE SUPPORT)
80.0000 ug | PREFILLED_SYRINGE | INTRAVENOUS | Status: DC | PRN
  Filled 2013-12-12: qty 10
  Filled 2013-12-12: qty 2

## 2013-12-12 MED ORDER — EPHEDRINE 5 MG/ML INJ
10.0000 mg | INTRAVENOUS | Status: DC | PRN
  Filled 2013-12-12: qty 2

## 2013-12-12 MED ORDER — EPHEDRINE 5 MG/ML INJ
10.0000 mg | INTRAVENOUS | Status: DC | PRN
  Filled 2013-12-12: qty 4
  Filled 2013-12-12: qty 2

## 2013-12-12 MED ORDER — LACTATED RINGERS IV SOLN: 500.0000 mL | Freq: Once | INTRAVENOUS | Status: DC

## 2013-12-12 MED ORDER — OXYTOCIN BOLUS FROM INFUSION: 500.0000 mL | INTRAVENOUS | Status: DC

## 2013-12-12 MED ORDER — ACETAMINOPHEN 325 MG PO TABS: 650.0000 mg | ORAL_TABLET | ORAL | Status: DC | PRN

## 2013-12-12 MED ORDER — FENTANYL 2.5 MCG/ML BUPIVACAINE 1/10 % EPIDURAL INFUSION (WH - ANES): 14.0000 mL/h | INTRAMUSCULAR | Status: DC | PRN

## 2013-12-12 MED ORDER — ZOLPIDEM TARTRATE 5 MG PO TABS: 5.0000 mg | ORAL_TABLET | Freq: Every evening | ORAL | Status: DC | PRN

## 2013-12-12 MED ORDER — OXYTOCIN 40 UNITS IN LACTATED RINGERS INFUSION - SIMPLE MED
1.0000 m[IU]/min | INTRAVENOUS | Status: DC
  Administered 2013-12-12: 2 m[IU]/min via INTRAVENOUS
  Filled 2013-12-12: qty 1000

## 2013-12-12 MED ORDER — LIDOCAINE HCL (PF) 1 % IJ SOLN
30.0000 mL | INTRAMUSCULAR | Status: DC | PRN
  Administered 2013-12-12: 30 mL via SUBCUTANEOUS
  Filled 2013-12-12: qty 30

## 2013-12-12 MED ORDER — LACTATED RINGERS IV SOLN
INTRAVENOUS | Status: DC
  Administered 2013-12-12: 20:00:00 via INTRAVENOUS

## 2013-12-12 MED ORDER — OXYCODONE-ACETAMINOPHEN 5-325 MG PO TABS: 1.0000 | ORAL_TABLET | ORAL | Status: DC | PRN

## 2013-12-12 MED ORDER — ONDANSETRON HCL 4 MG/2ML IJ SOLN: 4.0000 mg | Freq: Four times a day (QID) | INTRAMUSCULAR | Status: DC | PRN

## 2013-12-12 MED ORDER — DIPHENHYDRAMINE HCL 50 MG/ML IJ SOLN: 12.5000 mg | INTRAMUSCULAR | Status: DC | PRN

## 2013-12-12 MED ORDER — IBUPROFEN 600 MG PO TABS
600.0000 mg | ORAL_TABLET | Freq: Four times a day (QID) | ORAL | Status: DC | PRN
  Administered 2013-12-13: 600 mg via ORAL
  Filled 2013-12-12: qty 1

## 2013-12-12 MED ORDER — FENTANYL 2.5 MCG/ML BUPIVACAINE 1/10 % EPIDURAL INFUSION (WH - ANES)
14.0000 mL/h | INTRAMUSCULAR | Status: DC | PRN
  Filled 2013-12-12: qty 125

## 2013-12-12 MED ORDER — OXYTOCIN 40 UNITS IN LACTATED RINGERS INFUSION - SIMPLE MED: 62.5000 mL/h | INTRAVENOUS | Status: DC

## 2013-12-12 MED ORDER — PHENYLEPHRINE 40 MCG/ML (10ML) SYRINGE FOR IV PUSH (FOR BLOOD PRESSURE SUPPORT)
80.0000 ug | PREFILLED_SYRINGE | INTRAVENOUS | Status: DC | PRN
Start: 2013-12-12 — End: 2013-12-13
  Filled 2013-12-12: qty 2

## 2013-12-12 NOTE — Progress Notes (Signed)
   Subjective: Pt reports not feeling contractions.   Objective: BP 130/78  Pulse 78  Temp(Src) 98.2 F (36.8 C) (Oral)  Resp 18  Ht 5\' 5"  (1.651 m)  Wt 65.772 kg (145 lb)  BMI 24.13 kg/m2  LMP 03/19/2013      FHT:  FHR: 140's bpm, variability: moderate,  accelerations:  Present,  decelerations:  Absent UC:   irritability SVE:     2.5 cm  Labs: Lab Results  Component Value Date   WBC 9.3 12/12/2013   HGB 14.3 12/12/2013   HCT 42.0 12/12/2013   MCV 83.8 12/12/2013   PLT 237 12/12/2013    Assessment / Plan: Augmentation of labor, progressing well  Labor: Pitocin not started.  Discuss with RN regarding start time.  Preeclampsia:  n/a Fetal Wellbeing:  Category I Pain Control:  Labor support without medications I/D:  GBS neg March 2015 Anticipated MOD:  NSVD  [facility][name] 12/12/2013, 3:23 PM

## 2013-12-12 NOTE — Progress Notes (Signed)
   Subjective: Pt reports increase pain with contractions.  Objective: BP 118/61  Pulse 78  Temp(Src) 98.2 F (36.8 C) (Oral)  Resp 18  Ht 5\' 5"  (1.651 m)  Wt 65.772 kg (145 lb)  BMI 24.13 kg/m2  LMP 03/19/2013      FHT:  FHR: 120's bpm, variability: moderate,  accelerations:  Present,  decelerations:  Absent UC:   regular, every 2-3 minutes SVE:   Dilation: 7 Effacement (%): 90 Station: 0 Exam by:: Roney MarionW. [name], CNM  Labs: Lab Results  Component Value Date   WBC 9.3 12/12/2013   HGB 14.3 12/12/2013   HCT 42.0 12/12/2013   MCV 83.8 12/12/2013   PLT 237 12/12/2013    Assessment / Plan: Augmentation of labor, progressing well  Labor: Progressing normally Preeclampsia:  n/a Fetal Wellbeing:  Category I Pain Control:  Labor support without medications I/D:  GBS neg Anticipated MOD:  NSVD  [facility],WALIDAH 12/12/2013, 9:23 PM

## 2013-12-12 NOTE — Progress Notes (Signed)
Pt instructed she may begin pushing with ctxs. 

## 2013-12-12 NOTE — Progress Notes (Signed)
Notified pt is ruptured, clear fluid, fht's dopplered, GBS negative, to go to room 170. No orders obtained

## 2013-12-12 NOTE — H&P (Signed)
[name] is a 23 y.o. female presenting for premature rupture of membranes at 1100 today. Pt reports pink tinged fluid.  Denies contractions.  [facility]  Pregnancy dated by LMP consistent with 13 wk ultrasound.  Pt complicated by possible IUGR based on AC diameter 8% and HC < 3% at 31 wks.  Received serial growth ultrasounds and BPP/dopplers.  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  No other complications noted.  History OB History   Grav Para Term Preterm Abortions TAB SAB Ect Mult Living   1              Past Medical History  Diagnosis Date  . Medical history non-contributory    Past Surgical History  Procedure Laterality Date  . No past surgeries     Family History: family history includes Diabetes in her maternal grandmother; Hypertension in her mother. Social History:  reports that she has never smoked. She has never used smokeless tobacco. She reports that she uses illicit drugs (Marijuana) about 4 times per week. She reports that she does not drink alcohol.   Review of Systems  Genitourinary:       Rupture of membranes  All other systems reviewed and are negative.     Blood pressure 139/75, pulse 82, temperature 97.7 F (36.5 C), temperature source Oral, resp. rate 20, last menstrual period 03/19/2013. Maternal Exam:  Uterine Assessment: Contraction strength is mild.  Contraction frequency is irregular.   Abdomen: Estimated fetal weight is 5.5-6lbs.   Fetal presentation: vertex     Fetal Exam Fetal Monitor Review: Baseline rate: 140's.  Variability: moderate (6-25 bpm).   Pattern: accelerations present and no decelerations.    Fetal State Assessment: Category I - tracings are normal.     Physical Exam  Constitutional: She is oriented to person, place, and time. She appears well-developed and well-nourished. No distress.  HENT:   Head: Normocephalic.  Neck: Normal range of motion. Neck supple.  Cardiovascular: Normal rate, regular rhythm and normal heart sounds.   Respiratory: Effort normal and breath sounds normal.  GI: Soft. There is no tenderness.  Genitourinary: No bleeding around the vagina.  Musculoskeletal: Normal range of motion. She exhibits no edema.  Neurological: She is alert and oriented to person, place, and time.  Skin: Skin is warm and dry.    Prenatal labs: ABO, Rh: O/Positive/-- (12/11 0000) Antibody: Negative (12/11 0000) Rubella: Immune (12/11 0000) RPR: Nonreactive (04/27 0000)  HBsAg: Negative (12/11 0000)  HIV: Non-reactive (12/11 0000)  GBS: Negative (06/14 0000)   Assessment/Plan: 23 yo G1P0 at 7315w2d wks IUP ?IUGR GBS neg  Category I Tracing  Plan: Admit to Birthing Suites No GBS prophylaxis or retesting due to term status. Begin Pitocin augmentation  [facility] 12/12/2013, 1:31 PM

## 2013-12-12 NOTE — MAU Note (Signed)
Per HMitchell, RN charge, pt to go to room 170

## 2013-12-12 NOTE — Anesthesia Preprocedure Evaluation (Deleted)

## 2013-12-12 NOTE — Progress Notes (Signed)
   Subjective: Pt reports slight increase in contractions.  Desires epidural when pain increases.   Objective: BP 122/73  Pulse 76  Temp(Src) 98.2 F (36.8 C) (Oral)  Resp 18  Ht 5\' 5"  (1.651 m)  Wt 65.772 kg (145 lb)  BMI 24.13 kg/m2  LMP 03/19/2013      FHT:  FHR: 130's bpm, variability: moderate,  accelerations:  Present,  decelerations:  Absent UC:   regular, every 2-4 minutes SVE:   Dilation: 3 Effacement (%): 80 Station: -1 Exam by:: CarMaxFelkel,rn  Labs: Lab Results  Component Value Date   WBC 9.3 12/12/2013   HGB 14.3 12/12/2013   HCT 42.0 12/12/2013   MCV 83.8 12/12/2013   PLT 237 12/12/2013   Pitocin:  674mu/min Assessment / Plan: Augmentation of labor, progressing well  Labor: Progressing normally Preeclampsia:  n/a Fetal Wellbeing:  Category I Pain Control:  Labor support without medications I/D:  GBS neg Anticipated MOD:  NSVD Defer cervical check until increase pain or 2100.  [facility] 12/12/2013, 7:19 PM

## 2013-12-12 NOTE — H&P (Signed)
  Prenatal Transfer Tool  Maternal Diabetes: No Genetic Screening: Normal Maternal Ultrasounds/Referrals: Normal Fetal Ultrasounds or other Referrals:  None Maternal Substance Abuse:  No Significant Maternal Medications:  None Significant Maternal Lab Results: None  Attestation of Attending Supervision of Advanced Practitioner (CNM/NP): Evaluation and management procedures were performed by the Advanced Practitioner under my supervision and collaboration.  I have reviewed the Advanced Practitioner's note and chart, and I agree with the management and plan.  HARRAWAY-SMITH, CAROLYN 4:00 PM

## 2013-12-13 ENCOUNTER — Encounter (HOSPITAL_COMMUNITY): Payer: Self-pay | Admitting: *Deleted

## 2013-12-13 MED ORDER — DIPHENHYDRAMINE HCL 25 MG PO CAPS: 25.0000 mg | ORAL_CAPSULE | Freq: Four times a day (QID) | ORAL | Status: DC | PRN

## 2013-12-13 MED ORDER — IBUPROFEN 100 MG/5ML PO SUSP
600.0000 mg | Freq: Four times a day (QID) | ORAL | Status: DC
  Administered 2013-12-13 – 2013-12-14 (×5): 600 mg via ORAL
  Filled 2013-12-13 (×6): qty 30

## 2013-12-13 MED ORDER — WITCH HAZEL-GLYCERIN EX PADS: 1.0000 "application " | MEDICATED_PAD | CUTANEOUS | Status: DC | PRN

## 2013-12-13 MED ORDER — IBUPROFEN 600 MG PO TABS: 600.0000 mg | ORAL_TABLET | Freq: Four times a day (QID) | ORAL | Status: DC

## 2013-12-13 MED ORDER — BENZOCAINE-MENTHOL 20-0.5 % EX AERO
1.0000 "application " | INHALATION_SPRAY | CUTANEOUS | Status: DC | PRN
  Administered 2013-12-13: 1 via TOPICAL
  Filled 2013-12-13: qty 56

## 2013-12-13 MED ORDER — ONDANSETRON HCL 4 MG PO TABS: 4.0000 mg | ORAL_TABLET | ORAL | Status: DC | PRN

## 2013-12-13 MED ORDER — ZOLPIDEM TARTRATE 5 MG PO TABS: 5.0000 mg | ORAL_TABLET | Freq: Every evening | ORAL | Status: DC | PRN

## 2013-12-13 MED ORDER — PRENATAL MULTIVITAMIN CH
1.0000 | ORAL_TABLET | Freq: Every day | ORAL | Status: DC
  Administered 2013-12-13: 1 via ORAL
  Filled 2013-12-13: qty 1

## 2013-12-13 MED ORDER — OXYCODONE-ACETAMINOPHEN 5-325 MG PO TABS
1.0000 | ORAL_TABLET | ORAL | Status: DC | PRN
Start: 2013-12-13 — End: 2013-12-14

## 2013-12-13 MED ORDER — TETANUS-DIPHTH-ACELL PERTUSSIS 5-2.5-18.5 LF-MCG/0.5 IM SUSP
0.5000 mL | Freq: Once | INTRAMUSCULAR | Status: DC
Start: 2013-12-13 — End: 2013-12-14

## 2013-12-13 MED ORDER — ONDANSETRON HCL 4 MG/2ML IJ SOLN: 4.0000 mg | INTRAMUSCULAR | Status: DC | PRN

## 2013-12-13 MED ORDER — SIMETHICONE 80 MG PO CHEW: 80.0000 mg | CHEWABLE_TABLET | ORAL | Status: DC | PRN

## 2013-12-13 MED ORDER — DIBUCAINE 1 % RE OINT: 1.0000 "application " | TOPICAL_OINTMENT | RECTAL | Status: DC | PRN

## 2013-12-13 MED ORDER — SENNOSIDES-DOCUSATE SODIUM 8.6-50 MG PO TABS
2.0000 | ORAL_TABLET | ORAL | Status: DC
  Administered 2013-12-13: 2 via ORAL
  Filled 2013-12-13: qty 2

## 2013-12-13 MED ORDER — LANOLIN HYDROUS EX OINT: TOPICAL_OINTMENT | CUTANEOUS | Status: DC | PRN

## 2013-12-13 NOTE — Progress Notes (Signed)
Post Partum Day 1  Subjective: no complaints, up ad lib, voiding and tolerating PO .  Breastfeeding well, desires Mirena for family planning.  Objective: Blood pressure 108/70, pulse 72, temperature 99.1 F (37.3 C), temperature source Oral, resp. rate 18, height 5\' 5"  (1.651 m), weight 65.772 kg (145 lb), last menstrual period 03/19/2013, SpO2 100.00%, unknown if currently breastfeeding.  Physical Exam:  General: alert, cooperative and appears stated age Lochia: appropriate Uterine Fundus: firm Incision: n/a DVT Evaluation: No evidence of DVT seen on physical exam. Negative Homan's sign.   Recent Labs  12/12/13 1300  HGB 14.3  HCT 42.0    Assessment/Plan: Plan for discharge tomorrow and Breastfeeding   LOS: 1 day   [facility] 12/13/2013, 8:01 AM

## 2013-12-13 NOTE — Progress Notes (Signed)
UR chart review completed.  

## 2013-12-14 ENCOUNTER — Encounter: Payer: Medicaid Other | Admitting: Obstetrics and Gynecology

## 2013-12-14 ENCOUNTER — Ambulatory Visit (HOSPITAL_COMMUNITY): Admission: RE | Admit: 2013-12-14 | Payer: Medicaid Other | Source: Ambulatory Visit

## 2013-12-14 MED ORDER — IBUPROFEN 100 MG/5ML PO SUSP: 600.0000 mg | Freq: Four times a day (QID) | ORAL | Status: DC

## 2013-12-14 NOTE — Discharge Summary (Signed)
  Obstetric Discharge Summary Reason for Admission: onset of labor Prenatal Procedures: none Intrapartum Procedures: spontaneous vaginal delivery Postpartum Procedures: none Complications-Operative and Postpartum: 2nd degree perineal laceration  Hospital Course: Pt was admitted on 6/28 for PROM and progressed to vaginal delivery on the same day. Note below. Pt has been doing well PP and desires discharge on PPD#2. Meeting milestones and stable.  Delivery Note Called to patient room for delivery. Patient pushed over intact perineum. At 10:07 PM a viable female was delivered via Vaginal, Spontaneous Delivery (Presentation: Right Occiput Anterior).  Infant delivered to maternal abdomen. APGAR: 9, 9; weight pending. Cord clamped x 2 and cut. Active management of third stage with traction, started Pitocin once placenta delivered. Placenta status: Intact, Spontaneous.  Cord: 3 vessels with the following complications: None.  Cord pH: n/a. Laceration repaired as below in usual fashion. Counts correct. Hemostatic. All questions answered, no additional patient concerns.   Anesthesia: Local  Episiotomy: None Lacerations: 1st degree;Perineal. 2nd degree vaginal Suture Repair: 3.0 vicryl rapide Est. Blood Loss (mL): 250  Mom to postpartum.  Baby to Couplet care / Skin to Skin. CNM present for entire delivery.   Sunnie Nielsenlexander, Natalie 12/12/2013, 10:43 PM        H/H: Lab Results  Component Value Date/Time   HGB 14.3 12/12/2013  1:00 PM   HGB 12.8 10/11/2013   HCT 42.0 12/12/2013  1:00 PM   HCT 39 10/11/2013    Filed Vitals:   12/14/13 0622  BP: 108/68  Pulse: 77  Temp: 98.2 F (36.8 C)  Resp: 20    Physical Exam: VSS NAD Abd: Appropriately tender, ND, Fundus @U -2 No c/c/e, Neg homan's sign, neg cords Lochia Appropriate  Discharge Diagnoses: Term Pregnancy-delivered  Discharge Information: Date: 12/27/2010 Activity: pelvic rest Diet: routine  Medications: PNV and  Ibuprofen Breast feeding:  Yes Condition: stable Instructions: refer to handout Discharge to: home      Medication List         flintstones complete 60 MG chewable tablet  Chew 2 tablets by mouth daily.     ibuprofen 100 MG/5ML suspension  Commonly known as:  ADVIL,MOTRIN  Take 30 mLs (600 mg total) by mouth every 6 (six) hours.           Follow-up Information   [facility]. (For Postpartum Visit and contraception placement)    Specialty:  Obstetrics and Gynecology   Contact information:   [address] [place] [id number] [phone]      Tawana ScaleODOM, Shantell Belongia RYAN 12/14/2013,8:52 AM

## 2013-12-14 NOTE — Discharge Instructions (Signed)

## 2013-12-23 ENCOUNTER — Encounter: Payer: Self-pay | Admitting: *Deleted

## 2014-01-05 ENCOUNTER — Encounter: Payer: Self-pay | Admitting: *Deleted

## 2014-01-17 ENCOUNTER — Ambulatory Visit: Payer: Medicaid Other | Admitting: Family Medicine

## 2014-01-17 ENCOUNTER — Telehealth: Payer: Self-pay

## 2014-01-17 NOTE — Telephone Encounter (Signed)
Patient missed today's PP appointment. Attempted to call patient. No answer. Left message stating we are sorry you missed your appointment, please call clinic to reschedule.  

## 2014-04-18 ENCOUNTER — Encounter (HOSPITAL_COMMUNITY): Payer: Self-pay | Admitting: *Deleted

## 2014-05-31 ENCOUNTER — Encounter: Payer: Self-pay | Admitting: *Deleted

## 2016-02-13 ENCOUNTER — Emergency Department (HOSPITAL_COMMUNITY): Payer: Self-pay

## 2016-02-13 ENCOUNTER — Encounter (HOSPITAL_COMMUNITY): Payer: Self-pay

## 2016-02-13 ENCOUNTER — Emergency Department (HOSPITAL_COMMUNITY)
Admission: EM | Admit: 2016-02-13 | Discharge: 2016-02-13 | Disposition: A | Discharge status: Home / Self Care | Payer: Self-pay | Attending: Emergency Medicine | Admitting: Emergency Medicine

## 2016-02-13 DIAGNOSIS — R059 Cough, unspecified: Secondary | ICD-10-CM

## 2016-02-13 DIAGNOSIS — R05 Cough: Secondary | ICD-10-CM | POA: Insufficient documentation

## 2016-02-13 DIAGNOSIS — R0789 Other chest pain: Secondary | ICD-10-CM | POA: Insufficient documentation

## 2016-02-13 LAB — CBC WITH DIFFERENTIAL/PLATELET
Basophils Absolute: 0 10*3/uL (ref 0.0–0.1)
Basophils Relative: 0 %
EOS ABS: 0.2 10*3/uL (ref 0.0–0.7)
Eosinophils Relative: 3 %
HEMATOCRIT: 37.5 % (ref 36.0–46.0)
Hemoglobin: 12.3 g/dL (ref 12.0–15.0)
Lymphocytes Relative: 27 %
Lymphs Abs: 1.9 10*3/uL (ref 0.7–4.0)
MCH: 27.8 pg (ref 26.0–34.0)
MCHC: 32.8 g/dL (ref 30.0–36.0)
MCV: 84.7 fL (ref 78.0–100.0)
MONOS PCT: 9 %
Monocytes Absolute: 0.6 10*3/uL (ref 0.1–1.0)
NEUTROS ABS: 4.3 10*3/uL (ref 1.7–7.7)
Neutrophils Relative %: 61 %
Platelets: 281 10*3/uL (ref 150–400)
RBC: 4.43 MIL/uL (ref 3.87–5.11)
RDW: 12.5 % (ref 11.5–15.5)
WBC: 7 10*3/uL (ref 4.0–10.5)

## 2016-02-13 LAB — TROPONIN I

## 2016-02-13 LAB — BASIC METABOLIC PANEL
Anion gap: 6 (ref 5–15)
BUN: 7 mg/dL (ref 6–20)
CALCIUM: 8.8 mg/dL — AB (ref 8.9–10.3)
CO2: 27 mmol/L (ref 22–32)
CREATININE: 0.71 mg/dL (ref 0.44–1.00)
Chloride: 106 mmol/L (ref 101–111)
GFR calc Af Amer: >60 mL/min (ref >60)
Glucose, Bld: 90 mg/dL (ref 65–99)
Potassium: 3.5 mmol/L (ref 3.5–5.1)
Sodium: 139 mmol/L (ref 135–145)

## 2016-02-13 LAB — D-DIMER, QUANTITATIVE: D-Dimer, Quant: <0.27 ug/mL-FEU (ref 0.00–0.50)

## 2016-02-13 LAB — I-STAT BETA HCG BLOOD, ED (MC, WL, AP ONLY): I-stat hCG, quantitative: <5 m[IU]/mL (ref <5)

## 2016-02-13 MED ORDER — IBUPROFEN 400 MG PO TABS
400.0000 mg | ORAL_TABLET | Freq: Once | ORAL | Status: AC
  Administered 2016-02-13: 400 mg via ORAL
  Filled 2016-02-13: qty 1

## 2016-02-13 MED ORDER — BENZONATATE 100 MG PO CAPS: 100.0000 mg | ORAL_CAPSULE | Freq: Three times a day (TID) | ORAL | 0 refills | Status: DC

## 2016-02-13 NOTE — ED Provider Notes (Signed)
[facility] Provider Note   CSN: [id number] Arrival date & time: 02/13/16  4098     History   Chief Complaint Chief Complaint  Patient presents with  . Chest Pain    HPI [name] is a 25 y.o. female.  25 year old African-American female with no significant past medical history presents to the ED this morning with cough and right sided chest pain. Patient states that her right-sided chest pain started approximately midnight last night. The pain is constant and still present. It is gradually improved. The pain does not radiate. Coughing and moving makes the pain worse. She has tried nothing for the pain. She endorses cough and shortness of breath with inspiration. Denies any fever, chills, headache, vision changes, nausea, vomiting, abdominal pain, urinary symptoms, change in bowel habits. She denies any OCPs, recent hospitalizations or surgeries, prolonged immobilizations. Patient smokes cigarettes occasionally. No history of DVT. Denies any early cardiac family history. She has never had anything like this before.  URI symptoms started approximately 3 days ago. She endorses rhinorrhea, sore throat, and cough. She denies any sick contacts.      Past Medical History:  Diagnosis Date  . Medical history non-contributory     Patient Active Problem List   Diagnosis Date Noted  . Pregnancy 12/12/2013  . Pregnancy, supervision of first 12/02/2013  . Hyperemesis affecting pregnancy, antepartum 12/02/2013  . Maternal varicella, non-immune 12/02/2013  . Need for maternal serum alpha-protein (MSAFP) screening 12/02/2013  . Preterm uterine contractions, antepartum 09/14/2013    Past Surgical History:  Procedure Laterality Date  . NO PAST SURGERIES      OB History    Gravida Para Term Preterm AB Living   1 1 1     1    SAB TAB Ectopic Multiple Live Births           1       Home Medications    Prior to Admission medications   Medication Sig Start Date End Date  Taking? Authorizing Provider  flintstones complete (FLINTSTONES) 60 MG chewable tablet Chew 2 tablets by mouth daily.    Historical Provider, MD  ibuprofen (ADVIL,MOTRIN) 100 MG/5ML suspension Take 30 mLs (600 mg total) by mouth every 6 (six) hours. 12/14/13   Minta Balsam, MD    Family History Family History  Problem Relation Age of Onset  . Hypertension Mother   . Diabetes Maternal Grandmother     Social History Social History  Substance Use Topics  . Smoking status: Never Smoker  . Smokeless tobacco: Never Used  . Alcohol use No     Allergies   Review of patient's allergies indicates no known allergies.   Review of Systems Review of Systems  Constitutional: Negative for chills and fever.  HENT: Positive for congestion, rhinorrhea and sore throat. Negative for ear pain.   Eyes: Negative for pain and visual disturbance.  Respiratory: Positive for cough and shortness of breath.   Cardiovascular: Positive for chest pain. Negative for palpitations and leg swelling.  Gastrointestinal: Negative for abdominal pain, diarrhea, nausea and vomiting.  Genitourinary: Negative for dysuria, flank pain, frequency, hematuria and urgency.  Musculoskeletal: Negative for arthralgias and back pain.  Skin: Negative for color change and rash.  Neurological: Negative for dizziness, syncope, weakness, light-headedness, numbness and headaches.  All other systems reviewed and are negative.    Physical Exam Updated Vital Signs BP 127/81 (BP Location: Left Arm)   Pulse 73   Temp 98.1 F (36.7 C) (Oral)  Resp 18   Ht 5\' 6"  (1.676 m)   Wt 50.8 kg   SpO2 100%   BMI 18.08 kg/m   Physical Exam  Constitutional: She appears well-developed and well-nourished. No distress.  HENT:  Head: Normocephalic and atraumatic.  Right Ear: External ear normal.  Left Ear: External ear normal.  Nose: Nose normal.  Mouth/Throat: Oropharynx is clear and moist.  Eyes: Conjunctivae are normal. Pupils are  equal, round, and reactive to light. Right eye exhibits no discharge. Left eye exhibits no discharge. No scleral icterus.  Neck: Normal range of motion. Neck supple. No thyromegaly present.  Cardiovascular: Normal rate, regular rhythm, normal heart sounds and intact distal pulses.   Pulmonary/Chest: Effort normal and breath sounds normal.  Cough present on exam  Abdominal: Soft. Bowel sounds are normal. She exhibits no distension and no mass. There is no tenderness.  Musculoskeletal: Normal range of motion.  No calf tenderness or swelling bilaterally  Lymphadenopathy:    She has no cervical adenopathy.  Neurological: She is alert.  Skin: Skin is warm and dry. Capillary refill takes less than 2 seconds.  Vitals reviewed.    ED Treatments / Results  Labs (all labs ordered are listed, but only abnormal results are displayed) Labs Reviewed  BASIC METABOLIC PANEL - Abnormal; Notable for the following:       Result Value   Calcium 8.8 (*)    All other components within normal limits  CBC WITH DIFFERENTIAL/PLATELET  D-DIMER, QUANTITATIVE (NOT AT [facility])  TROPONIN I  I-STAT BETA HCG BLOOD, ED ([facility], [facility], [facility] ONLY)    EKG  EKG Interpretation  Date/Time:  Tuesday February 13 2016 07:44:40 EDT Ventricular Rate:  69 PR Interval:  128 QRS Duration: 78 QT Interval:  362 QTC Calculation: 387 R Axis:   75 Text Interpretation:  Normal sinus rhythm with sinus arrhythmia Nonspecific T wave abnormality Abnormal ECG No acute changes Confirmed by Rhunette Croft, MD, Janey Genta ([id number]) on 02/13/2016 8:20:08 AM Also confirmed by Rhunette Croft, MD, Janey Genta [phone]), editor [place] CT, Jola Babinski [phone])  on 02/13/2016 9:17:08 AM       Radiology Dg Chest 2 View  Result Date: 02/13/2016 CLINICAL DATA:  Right-sided chest pain for 3 days. Cough with shortness of breath EXAM: CHEST  2 VIEW COMPARISON:  None. FINDINGS: The lungs are clear. The heart size and pulmonary vascularity are normal. No adenopathy. No pneumothorax. No bone  lesions peer IMPRESSION: No edema or consolidation. Electronically Signed   By: Bretta Bang III M.D.   On: 02/13/2016 09:03    Procedures Procedures (including critical care time)  Medications Ordered in ED Medications - No data to display   Initial Impression / Assessment and Plan / ED Course  I have reviewed the triage vital signs and the nursing notes.  Pertinent labs & imaging results that were available during my care of the patient were reviewed by me and considered in my medical decision making (see chart for details).  Clinical Course  Patient is to be discharged with recommendation to follow up with PCP in regards to today's hospital visit. Chest pain is not likely of cardiac or pulmonary etiology d/t presentation, wells 0, perc negative, heart pathway score of 0, VSS, no tracheal deviation, no JVD or new murmur, RRR, breath sounds equal bilaterally, EKG with non specific t wave abnormalites, negative troponin, negative d-dimer, and negative CXR. Labs unremarkable. Pt has been advised toreturn to the ED is CP becomes exertional, associated with diaphoresis or nausea, radiates to left  jaw/arm, worsens or becomes concerning in any way. Tessalon given for cough. CP likely MSK in nature due to cough. Pt appears reliable for follow up and is agreeable to discharge. Discharged home in NAD with stable VS.  Case has been discussed with and seen by Dr. Rhunette CroftNanavati who agrees with the above plan to discharge.   Final Clinical Impressions(s) / ED Diagnoses   Final diagnoses:  Atypical chest pain  Cough    New Prescriptions Discharge Medication List as of 02/13/2016 11:58 AM    START taking these medications   Details  benzonatate (TESSALON) 100 MG capsule Take 1 capsule (100 mg total) by mouth every 8 (eight) hours., Starting Tue 02/13/2016, Print         Rise MuKenneth T Leaphart, PA-C 02/13/16 1605    Derwood KaplanAnkit Nanavati, MD 02/14/16 2034

## 2016-02-13 NOTE — ED Triage Notes (Signed)
Patient complains of right sided sharp CP since last pm. Pain worse with movement and inspiration, has had cold symptoms. NAD

## 2016-02-13 NOTE — ED Notes (Signed)
Pt being transported to X-Ray.

## 2016-02-13 NOTE — Discharge Instructions (Signed)
Take tessalon for cough. May take tylenol and ibuprofen for pain. If you develop severe cp or sob return to the ED. Follow up with primary care if symptoms worsen.

## 2016-10-25 ENCOUNTER — Encounter (HOSPITAL_COMMUNITY): Payer: Self-pay | Admitting: Emergency Medicine

## 2016-10-25 ENCOUNTER — Ambulatory Visit (HOSPITAL_COMMUNITY)
Admission: EM | Admit: 2016-10-25 | Discharge: 2016-10-25 | Disposition: A | Discharge status: Home / Self Care | Payer: Medicaid Other | Attending: Internal Medicine | Admitting: Internal Medicine

## 2016-10-25 DIAGNOSIS — B955 Unspecified streptococcus as the cause of diseases classified elsewhere: Secondary | ICD-10-CM | POA: Insufficient documentation

## 2016-10-25 DIAGNOSIS — Z79899 Other long term (current) drug therapy: Secondary | ICD-10-CM | POA: Insufficient documentation

## 2016-10-25 DIAGNOSIS — J020 Streptococcal pharyngitis: Secondary | ICD-10-CM | POA: Insufficient documentation

## 2016-10-25 LAB — POCT RAPID STREP A: Streptococcus, Group A Screen (Direct): NEGATIVE

## 2016-10-25 MED ORDER — AMOXICILLIN 500 MG PO CAPS: 1000.0000 mg | ORAL_CAPSULE | Freq: Two times a day (BID) | ORAL | 0 refills | Status: DC

## 2016-10-25 NOTE — ED Triage Notes (Signed)
Pt c/o sore throat x 2 days. Felt nauseous, is hard to swallow both food and liquids. Felt feverish. Has also had headaches.

## 2016-10-25 NOTE — ED Provider Notes (Signed)
CSN: [id number]     Arrival date & time 10/25/16  2000 History   First MD Initiated Contact with Patient 10/25/16 2031     Chief Complaint  Patient presents with  . Sore Throat   (Consider location/radiation/quality/duration/timing/severity/associated sxs/prior Treatment) 26 year old female complaining of a sore throat that developed yesterday and getting worse today. She did not take her fever but felt unusually warm. Denies PND.      Past Medical History:  Diagnosis Date  . Medical history non-contributory    Past Surgical History:  Procedure Laterality Date  . NO PAST SURGERIES     Family History  Problem Relation Age of Onset  . Hypertension Mother   . Diabetes Maternal Grandmother    Social History  Substance Use Topics  . Smoking status: Never Smoker  . Smokeless tobacco: Never Used  . Alcohol use 0.6 oz/week    1 Cans of beer per week   OB History    Gravida Para Term Preterm AB Living   1 1 1     1    SAB TAB Ectopic Multiple Live Births           1     Review of Systems  Constitutional: Negative for activity change and fever.  HENT: Positive for sore throat. Negative for postnasal drip.   Respiratory: Negative.   Gastrointestinal: Negative.   Neurological: Negative.   All other systems reviewed and are negative.   Allergies  Patient has no known allergies.  Home Medications   Prior to Admission medications   Medication Sig Start Date End Date Taking? Authorizing Provider  flintstones complete (FLINTSTONES) 60 MG chewable tablet Chew 2 tablets by mouth daily.   Yes [provider]  amoxicillin (AMOXIL) 500 MG capsule Take 2 capsules (1,000 mg total) by mouth 2 (two) times daily. 10/25/16   Hayden Rasmussen, NP  benzonatate (TESSALON) 100 MG capsule Take 1 capsule (100 mg total) by mouth every 8 (eight) hours. 02/13/16   Rise Mu, PA-C  ibuprofen (ADVIL,MOTRIN) 100 MG/5ML suspension Take 30 mLs (600 mg total) by mouth every 6 (six) hours.  12/14/13   Minta Balsam, MD   Meds Ordered and Administered this Visit  Medications - No data to display  BP 132/78 (BP Location: Left Arm)   Pulse 77   Temp 98.9 F (37.2 C) (Oral)   Resp 12   LMP 10/24/2016   SpO2 99%  No data found.   Physical Exam  Constitutional: She is oriented to person, place, and time. She appears well-developed and well-nourished. No distress.  HENT:  Lateral TMs are normal. Oropharynx with a dark erythema of the posterior pharynx palatine arch. Uvula with few petechiae. No swelling or exudate. Airway widely patent.  Eyes: EOM are normal.  Neck: Normal range of motion. Neck supple.  Bilateral anterior cervical adenopathy  Cardiovascular: Normal rate and regular rhythm.   Pulmonary/Chest: Effort normal.  Musculoskeletal: Normal range of motion.  Lymphadenopathy:    She has cervical adenopathy.  Neurological: She is alert and oriented to person, place, and time.  Skin: Skin is warm and dry.  Psychiatric: She has a normal mood and affect.  Nursing note and vitals reviewed.   Urgent Care Course     Procedures (including critical care time)  Labs Review Labs Reviewed  POCT RAPID STREP A    Imaging Review No results found.   Visual Acuity Review  Right Eye Distance:   Left Eye Distance:   Bilateral Distance:  Right Eye Near:   Left Eye Near:    Bilateral Near:         MDM   1. Pharyngitis due to Streptococcus species    Take the amoxicillin as directed. Cepacol lozenges for sore throat pain. Ibuprofen 600 mg every 6 hours as needed for sore throat pain or fever. Drink lots of fluids and stay well-hydrated. Meds ordered this encounter  Medications  . amoxicillin (AMOXIL) 500 MG capsule    Sig: Take 2 capsules (1,000 mg total) by mouth 2 (two) times daily.    Dispense:  32 capsule    Refill:  0    Order Specific Question:   Supervising Provider    Answer:   Eustace MooreMURRAY, LAURA W [[id number]]       Hayden RasmussenMabe, Mckenzie Bove, NP 10/25/16  2056

## 2016-10-25 NOTE — Discharge Instructions (Signed)
Take the amoxicillin as directed. Cepacol lozenges for sore throat pain. Ibuprofen 600 mg every 6 hours as needed for sore throat pain or fever. Drink lots of fluids and stay well-hydrated.

## 2016-10-28 ENCOUNTER — Other Ambulatory Visit: Payer: Self-pay | Admitting: Emergency Medicine

## 2016-10-28 LAB — CULTURE, GROUP A STREP (THRC)

## 2016-10-28 NOTE — Telephone Encounter (Signed)
Opened in error

## 2016-11-15 ENCOUNTER — Encounter (HOSPITAL_COMMUNITY): Payer: Self-pay

## 2016-11-15 ENCOUNTER — Ambulatory Visit (HOSPITAL_COMMUNITY)
Admission: EM | Admit: 2016-11-15 | Discharge: 2016-11-15 | Disposition: A | Discharge status: Home / Self Care | Payer: Self-pay | Attending: Internal Medicine | Admitting: Internal Medicine

## 2016-11-15 DIAGNOSIS — J020 Streptococcal pharyngitis: Secondary | ICD-10-CM

## 2016-11-15 MED ORDER — PENICILLIN G BENZATHINE 1200000 UNIT/2ML IM SUSP
INTRAMUSCULAR | Status: AC
  Filled 2016-11-15: qty 2

## 2016-11-15 MED ORDER — FLUCONAZOLE 150 MG PO TABS: 150.0000 mg | ORAL_TABLET | Freq: Every day | ORAL | 0 refills | Status: DC

## 2016-11-15 MED ORDER — PENICILLIN G BENZATHINE 1200000 UNIT/2ML IM SUSP
1.2000 10*6.[IU] | Freq: Once | INTRAMUSCULAR | Status: AC
  Administered 2016-11-15: 1.2 10*6.[IU] via INTRAMUSCULAR

## 2016-11-15 NOTE — ED Triage Notes (Signed)
Pt here for sore throat again, said it never has gotten better. Did not finish her antibiotics said she had a bad yeast infx and it was breaking our her face and arm so she didn't continue it. No fever

## 2016-11-15 NOTE — ED Provider Notes (Signed)
[facility]    CSN: [id number] Arrival date & time: 11/15/16  1757     History   Chief Complaint Chief Complaint  Patient presents with  . Sore Throat    HPI [name] is a 26 y.o. female.   Pt c/o sore throat.  Recent Strep positive and Rx'd PCN but she did not finish taking her abx because of yeast infection that developed.  The patient only took the medication for 4 days and reports a fine rash as well.  Denies difficulty swallowing or breathing. Also denies fever.       Past Medical History:  Diagnosis Date  . Medical history non-contributory     Patient Active Problem List   Diagnosis Date Noted  . Pregnancy 12/12/2013  . Pregnancy, supervision of first 12/02/2013  . Hyperemesis affecting pregnancy, antepartum 12/02/2013  . Maternal varicella, non-immune 12/02/2013  . Need for maternal serum alpha-protein (MSAFP) screening 12/02/2013  . Preterm uterine contractions, antepartum 09/14/2013    Past Surgical History:  Procedure Laterality Date  . NO PAST SURGERIES      OB History    Gravida Para Term Preterm AB Living   1 1 1     1    SAB TAB Ectopic Multiple Live Births           1       Home Medications    Prior to Admission medications   Medication Sig Start Date End Date Taking? Authorizing Provider  amoxicillin (AMOXIL) 500 MG capsule Take 2 capsules (1,000 mg total) by mouth 2 (two) times daily. 10/25/16   Hayden RasmussenMabe, David, NP  benzonatate (TESSALON) 100 MG capsule Take 1 capsule (100 mg total) by mouth every 8 (eight) hours. 02/13/16   Rise MuLeaphart, Kenneth T, PA-C  flintstones complete (FLINTSTONES) 60 MG chewable tablet Chew 2 tablets by mouth daily.    [provider]  fluconazole (DIFLUCAN) 150 MG tablet Take 1 tablet (150 mg total) by mouth daily. Take 1 tablet at the onset of vaginal symptoms.  If symptoms do not resolve in 1 week take the other tablet 11/15/16   Arnaldo Nataliamond, Deanie Jupiter S, MD  ibuprofen (ADVIL,MOTRIN) 100 MG/5ML  suspension Take 30 mLs (600 mg total) by mouth every 6 (six) hours. 12/14/13   Minta Balsamdom, Seira Cody R, MD    Family History Family History  Problem Relation Age of Onset  . Hypertension Mother   . Diabetes Maternal Grandmother     Social History Social History  Substance Use Topics  . Smoking status: Never Smoker  . Smokeless tobacco: Never Used  . Alcohol use 0.6 oz/week    1 Cans of beer per week     Allergies   Amoxicillin   Review of Systems Review of Systems  Constitutional: Negative for chills and fever.  HENT: Negative for tinnitus.   Eyes: Negative for redness.  Respiratory: Negative for cough and shortness of breath.   Cardiovascular: Negative for chest pain and palpitations.  Gastrointestinal: Negative for abdominal pain, diarrhea, nausea and vomiting.  Genitourinary: Negative for dysuria, frequency and urgency.  Musculoskeletal: Negative for myalgias.  Skin: Negative for rash.       No lesions  Neurological: Negative for weakness.  Hematological: Does not bruise/bleed easily.  Psychiatric/Behavioral: Negative for suicidal ideas.     Physical Exam Triage Vital Signs ED Triage Vitals  Enc Vitals Group     BP 11/15/16 1844 124/76     Pulse Rate 11/15/16 1844 64     Resp  11/15/16 1844 20     Temp 11/15/16 1844 98.8 F (37.1 C)     Temp Source 11/15/16 1844 Oral     SpO2 11/15/16 1844 100 %     Weight --      Height --      Head Circumference --      Peak Flow --      Pain Score 11/15/16 1846 7     Pain Loc --      Pain Edu? --      Excl. in GC? --    No data found.   Updated Vital Signs BP 124/76 (BP Location: Right Arm)   Pulse 64   Temp 98.8 F (37.1 C) (Oral)   Resp 20   LMP 10/24/2016   SpO2 100%   Breastfeeding? No   Visual Acuity Right Eye Distance:   Left Eye Distance:   Bilateral Distance:    Right Eye Near:   Left Eye Near:    Bilateral Near:     Physical Exam  Constitutional: She appears well-developed and well-nourished.  No distress.  HENT:  Head: Normocephalic and atraumatic.  Mouth/Throat: Posterior oropharyngeal erythema present.  Tonsils are small and oropharynx crowded; difficult to visualize if exudate.   Eyes: Conjunctivae are normal.  Neck: Neck supple.  Cardiovascular: Normal rate and regular rhythm.   No murmur heard. Pulmonary/Chest: Effort normal and breath sounds normal. No respiratory distress.  Abdominal: Soft. There is no tenderness.  Musculoskeletal: She exhibits no edema.  Neurological: She is alert.  Skin: Skin is warm and dry.  Psychiatric: She has a normal mood and affect.  Nursing note and vitals reviewed.    UC Treatments / Results  Labs (all labs ordered are listed, but only abnormal results are displayed) Labs Reviewed - No data to display  EKG  EKG Interpretation None       Radiology No results found.  Procedures Procedures (including critical care time)  Medications Ordered in UC Medications  penicillin g benzathine (BICILLIN LA) 1200000 UNIT/2ML injection 1.2 Million Units (not administered)     Initial Impression / Assessment and Plan / UC Course  I have reviewed the triage vital signs and the nursing notes.  Pertinent labs & imaging results that were available during my care of the patient were reviewed by me and considered in my medical decision making (see chart for details).     Incomplete abx treatment.  Will do PCN IM. Drug reaction minor, not true allergy. Diflucan rx preemptively.   Final Clinical Impressions(s) / UC Diagnoses   Final diagnoses:  Pharyngitis due to Streptococcus species    New Prescriptions New Prescriptions   FLUCONAZOLE (DIFLUCAN) 150 MG TABLET    Take 1 tablet (150 mg total) by mouth daily. Take 1 tablet at the onset of vaginal symptoms.  If symptoms do not resolve in 1 week take the other tablet     Arnaldo Natal, MD 11/15/16 1910

## 2018-11-13 ENCOUNTER — Ambulatory Visit (HOSPITAL_COMMUNITY)
Admission: EM | Admit: 2018-11-13 | Discharge: 2018-11-13 | Disposition: A | Discharge status: Home / Self Care | Payer: Self-pay | Attending: Family Medicine | Admitting: Family Medicine

## 2018-11-13 ENCOUNTER — Emergency Department (HOSPITAL_COMMUNITY)
Admission: EM | Admit: 2018-11-13 | Discharge: 2018-11-13 | Disposition: A | Discharge status: Home / Self Care | Payer: Self-pay | Attending: Emergency Medicine | Admitting: Emergency Medicine

## 2018-11-13 ENCOUNTER — Other Ambulatory Visit: Payer: Self-pay

## 2018-11-13 ENCOUNTER — Ambulatory Visit (INDEPENDENT_AMBULATORY_CARE_PROVIDER_SITE_OTHER): Payer: Self-pay

## 2018-11-13 ENCOUNTER — Encounter (HOSPITAL_COMMUNITY): Payer: Self-pay

## 2018-11-13 ENCOUNTER — Encounter (HOSPITAL_COMMUNITY): Payer: Self-pay | Admitting: Emergency Medicine

## 2018-11-13 DIAGNOSIS — S6990XA Unspecified injury of unspecified wrist, hand and finger(s), initial encounter: Secondary | ICD-10-CM | POA: Insufficient documentation

## 2018-11-13 DIAGNOSIS — Y999 Unspecified external cause status: Secondary | ICD-10-CM | POA: Insufficient documentation

## 2018-11-13 DIAGNOSIS — Y929 Unspecified place or not applicable: Secondary | ICD-10-CM | POA: Insufficient documentation

## 2018-11-13 DIAGNOSIS — S61421A Laceration with foreign body of right hand, initial encounter: Secondary | ICD-10-CM

## 2018-11-13 DIAGNOSIS — Z23 Encounter for immunization: Secondary | ICD-10-CM

## 2018-11-13 DIAGNOSIS — S6991XA Unspecified injury of right wrist, hand and finger(s), initial encounter: Secondary | ICD-10-CM

## 2018-11-13 DIAGNOSIS — Y939 Activity, unspecified: Secondary | ICD-10-CM | POA: Insufficient documentation

## 2018-11-13 DIAGNOSIS — Z5321 Procedure and treatment not carried out due to patient leaving prior to being seen by health care provider: Secondary | ICD-10-CM | POA: Insufficient documentation

## 2018-11-13 DIAGNOSIS — W25XXXA Contact with sharp glass, initial encounter: Secondary | ICD-10-CM | POA: Insufficient documentation

## 2018-11-13 MED ORDER — AMOXICILLIN-POT CLAVULANATE 875-125 MG PO TABS: 1.0000 | ORAL_TABLET | Freq: Two times a day (BID) | ORAL | 0 refills | Status: AC

## 2018-11-13 MED ORDER — LIDOCAINE HCL 2 % IJ SOLN
INTRAMUSCULAR | Status: AC
  Filled 2018-11-13: qty 20

## 2018-11-13 MED ORDER — TETANUS-DIPHTH-ACELL PERTUSSIS 5-2.5-18.5 LF-MCG/0.5 IM SUSP
INTRAMUSCULAR | Status: AC
  Filled 2018-11-13: qty 0.5

## 2018-11-13 MED ORDER — TETANUS-DIPHTH-ACELL PERTUSSIS 5-2.5-18.5 LF-MCG/0.5 IM SUSP
0.5000 mL | Freq: Once | INTRAMUSCULAR | Status: AC
  Administered 2018-11-13: 12:00:00 0.5 mL via INTRAMUSCULAR

## 2018-11-13 NOTE — ED Provider Notes (Addendum)
[facility]    CSN: [id number] Arrival date & time: 11/13/18  1029     History   Chief Complaint Chief Complaint  Patient presents with  . Laceration    HPI [name] is a 28 y.o. female.   Patient is a 28 year old female who presents today with injury to the right hand.  She reports punching through a glass window last night.  She has multiple lacerations to the hand and wrist area.  Bleeding is controlled.  Good range of motion.  She is worried that she has broken her hand.  No swelling, bruising or deformities.  She is not taking thing for her symptoms.  She did clean with peroxide and bandaged.  Last tetanus was 5 years ago.  ROS per HPI      Past Medical History:  Diagnosis Date  . Medical history non-contributory     Patient Active Problem List   Diagnosis Date Noted  . Pregnancy 12/12/2013  . Pregnancy, supervision of first 12/02/2013  . Hyperemesis affecting pregnancy, antepartum 12/02/2013  . Maternal varicella, non-immune 12/02/2013  . Need for maternal serum alpha-protein (MSAFP) screening 12/02/2013  . Preterm uterine contractions, antepartum 09/14/2013    Past Surgical History:  Procedure Laterality Date  . NO PAST SURGERIES      OB History    Gravida  1   Para  1   Term  1   Preterm      AB      Living  1     SAB      TAB      Ectopic      Multiple      Live Births  1            Home Medications    Prior to Admission medications   Medication Sig Start Date End Date Taking? Authorizing Provider  amoxicillin-clavulanate (AUGMENTIN) 875-125 MG tablet Take 1 tablet by mouth every 12 (twelve) hours for 5 days. 11/13/18 11/18/18  Dahlia Byes A, NP  flintstones complete (FLINTSTONES) 60 MG chewable tablet Chew 2 tablets by mouth daily.    [provider]  ibuprofen (ADVIL,MOTRIN) 100 MG/5ML suspension Take 30 mLs (600 mg total) by mouth every 6 (six) hours. 12/14/13   Minta Balsam, MD    Family  History Family History  Problem Relation Age of Onset  . Hypertension Mother   . Diabetes Maternal Grandmother     Social History Social History   Tobacco Use  . Smoking status: Never Smoker  . Smokeless tobacco: Never Used  Substance Use Topics  . Alcohol use: Yes    Alcohol/week: 1.0 standard drinks    Types: 1 Cans of beer per week  . Drug use: Yes    Frequency: 3.0 times per week    Types: Marijuana     Allergies   Amoxicillin   Review of Systems Review of Systems   Physical Exam Triage Vital Signs ED Triage Vitals  Enc Vitals Group     BP 11/13/18 1054 (!) 145/94     Pulse Rate 11/13/18 1054 87     Resp 11/13/18 1054 18     Temp 11/13/18 1054 98.6 F (37 C)     Temp Source 11/13/18 1054 Oral     SpO2 11/13/18 1054 100 %     Weight --      Height --      Head Circumference --      Peak Flow --  Pain Score 11/13/18 1058 7     Pain Loc --      Pain Edu? --      Excl. in GC? --    No data found.  Updated Vital Signs BP (!) 145/94 (BP Location: Right Arm)   Pulse 87   Temp 98.6 F (37 C) (Oral)   Resp 18   LMP 08/22/2018   SpO2 100%   Visual Acuity Right Eye Distance:   Left Eye Distance:   Bilateral Distance:    Right Eye Near:   Left Eye Near:    Bilateral Near:     Physical Exam Vitals signs and nursing note reviewed.  Constitutional:      Appearance: Normal appearance.  HENT:     Head: Normocephalic and atraumatic.     Nose: Nose normal.  Eyes:     Conjunctiva/sclera: Conjunctivae normal.  Neck:     Musculoskeletal: Normal range of motion.  Pulmonary:     Effort: Pulmonary effort is normal.  Musculoskeletal: Normal range of motion.     Comments: Good flexion and extension of the fingers.  Sensation and radial pulse present. Good cap refill.   Skin:    General: Skin is warm and dry.     Comments: See pictures for detail   Neurological:     Mental Status: She is alert.  Psychiatric:        Mood and Affect: Mood  normal.              UC Treatments / Results  Labs (all labs ordered are listed, but only abnormal results are displayed) Labs Reviewed - No data to display  EKG None  Radiology Dg Hand Complete Right  Result Date: 11/13/2018 CLINICAL DATA:  Punched glass with right hand EXAM: RIGHT HAND - COMPLETE 3+ VIEW COMPARISON:  None. FINDINGS: Frontal, oblique, and lateral views obtained. There is a 2 mm radiopaque foreign body dorsal to the distal aspect of the fifth metacarpal, a likely small glass fragment. On the oblique view, there is a tiny radiopaque foreign body medial to the midportion of the fifth metacarpal, a probable second tiny glass fragment. No other radiopaque foreign bodies are evident. No fracture or dislocation. Joint spaces appear normal. No erosive change. IMPRESSION: 2 mm probable glass fragment dorsal to the distal fifth metacarpal. Probable second tiny glass fragment medial to the midportion of the fifth metacarpal, seen only on the oblique view. No fracture or dislocation. No bony abnormality. No appreciable arthropathy. These results will be called to the ordering clinician or representative by the Radiologist Assistant, and communication documented in the PACS or zVision Dashboard. Electronically Signed   By: Bretta Bang III M.D.   On: 11/13/2018 11:57    Procedures Laceration Repair Date/Time: 11/13/2018 3:22 PM Performed by: Janace Aris, NP Authorized by: Eustace Moore, MD   Consent:    Consent obtained:  Verbal   Consent given by:  Patient   Risks discussed:  Infection, need for additional repair, pain, poor cosmetic result and poor wound healing   Alternatives discussed:  No treatment and delayed treatment Universal protocol:    Procedure explained and questions answered to patient or proxy's satisfaction: yes     Relevant documents present and verified: yes     Test results available and properly labeled: yes     Imaging studies available:  yes     Required blood products, implants, devices, and special equipment available: yes     Site/side marked:  yes     Immediately prior to procedure, a time out was called: yes     Patient identity confirmed:  Verbally with patient Anesthesia (see MAR for exact dosages):    Anesthesia method:  Local infiltration   Local anesthetic:  Lidocaine 2% w/o epi Laceration details:    Location:  Hand   Hand location:  L hand, dorsum Repair type:    Repair type:  Complex Pre-procedure details:    Preparation:  Patient was prepped and draped in usual sterile fashion Exploration:    Limited defect created (wound extended): yes     Hemostasis achieved with:  Direct pressure   Wound exploration: wound explored through full range of motion     Wound extent: foreign bodies/material     Wound extent: no muscle damage noted, no nerve damage noted, no tendon damage noted, no underlying fracture noted and no vascular damage noted     Foreign bodies/material:  Glass Treatment:    Area cleansed with:  Betadine and saline   Amount of cleaning:  Extensive   Irrigation solution:  Sterile water   Irrigation method:  Pressure wash   Visualized foreign bodies/material removed: yes     Debridement:  Minimal   Scar revision: no   Skin repair:    Repair method:  Sutures   Suture size:  5-0   Suture material:  Nylon   Suture technique:  Simple interrupted   Number of sutures:  4 Post-procedure details:    Dressing:  Antibiotic ointment   Patient tolerance of procedure:  Tolerated well, no immediate complications   (including critical care time)  Medications Ordered in UC Medications  Tdap (BOOSTRIX) injection 0.5 mL (0.5 mLs Intramuscular Given 11/13/18 1154)    Initial Impression / Assessment and Plan / UC Course  I have reviewed the triage vital signs and the nursing notes.  Pertinent labs & imaging results that were available during my care of the patient were reviewed by me and considered in my  medical decision making (see chart for details).     X ray negative for any acute fracture. Did show to pieces of glass which we removed here in clinic. Sutured the wound to the top of the hand with 4 simple sutures  After thoroughly cleaning and irrigating. Plan Steri-Strips on 2 wounds on the right lateral forearm Cleaned and wrapped wounds on 2 fingers. Will place on Augmentin prophylactically due to extent of wound on the dorsal hand.  Was able to view tendon but appeared intact with full range of motion of fingers and hand. Updated tetanus and instructed to watch for signs of infection to close there is redness swelling or drainage. Return in 10 to 14 days for stitch removal Final Clinical Impressions(s) / UC Diagnoses   Final diagnoses:  Injury of right hand, initial encounter  Laceration of right hand with foreign body, initial encounter     Discharge Instructions     You will need to return in 10-14 days to have the stiches removed.  Keep the area clean and dry.  We updated your tetanus Watch for signs of infection to include severe redness, swelling or drainage. Take the abx as prescribed.  Follow up as needed for continued or worsening symptoms'     ED Prescriptions    Medication Sig Dispense Auth. Provider   amoxicillin-clavulanate (AUGMENTIN) 875-125 MG tablet Take 1 tablet by mouth every 12 (twelve) hours for 5 days. 10 tablet Janace Aris, NP  Controlled Substance Prescriptions [place] Controlled Substance Registry consulted? Not Applicable   Janace ArisBast, Bellanie Matthew A, NP 11/13/18 1521    Dahlia ByesBast, Saskia Simerson A, NP 11/13/18 1523

## 2018-11-13 NOTE — ED Triage Notes (Signed)
Patient reports she punched a window, has laceration to middle finger. Bleeding controlled with pressure bandage. tetanus shot 4-5 years ago.

## 2018-11-13 NOTE — ED Triage Notes (Signed)
Pt presents with laceration to right hand and right right wrist.

## 2018-11-13 NOTE — Discharge Instructions (Signed)
You will need to return in 10-14 days to have the stiches removed.  Keep the area clean and dry.  We updated your tetanus Watch for signs of infection to include severe redness, swelling or drainage. Take the abx as prescribed.  Follow up as needed for continued or worsening symptoms'

## 2018-11-13 NOTE — ED Notes (Signed)
Patient has left the ED.

## 2018-11-23 ENCOUNTER — Other Ambulatory Visit: Payer: Self-pay

## 2018-11-23 ENCOUNTER — Ambulatory Visit (HOSPITAL_COMMUNITY)
Admission: EM | Admit: 2018-11-23 | Discharge: 2018-11-23 | Disposition: A | Discharge status: Home / Self Care | Payer: Self-pay

## 2018-11-23 DIAGNOSIS — Z4802 Encounter for removal of sutures: Secondary | ICD-10-CM

## 2018-11-23 NOTE — ED Triage Notes (Signed)
Two stitches removed from right hand, pt states the other two stitches she removed on her own at home.

## 2019-07-28 ENCOUNTER — Other Ambulatory Visit: Payer: Self-pay

## 2019-07-28 ENCOUNTER — Ambulatory Visit (HOSPITAL_COMMUNITY)
Admission: EM | Admit: 2019-07-28 | Discharge: 2019-07-28 | Disposition: A | Discharge status: Home / Self Care | Payer: BC Managed Care – PPO | Attending: Physician Assistant | Admitting: Physician Assistant

## 2019-07-28 ENCOUNTER — Encounter (HOSPITAL_COMMUNITY): Payer: Self-pay | Admitting: Emergency Medicine

## 2019-07-28 DIAGNOSIS — M791 Myalgia, unspecified site: Secondary | ICD-10-CM

## 2019-07-28 DIAGNOSIS — M7918 Myalgia, other site: Secondary | ICD-10-CM | POA: Diagnosis not present

## 2019-07-28 DIAGNOSIS — B079 Viral wart, unspecified: Secondary | ICD-10-CM | POA: Diagnosis not present

## 2019-07-28 MED ORDER — IBUPROFEN 800 MG PO TABS: 800.0000 mg | ORAL_TABLET | Freq: Three times a day (TID) | ORAL | 0 refills | Status: AC

## 2019-07-28 MED ORDER — CYCLOBENZAPRINE HCL 10 MG PO TABS: 10.0000 mg | ORAL_TABLET | Freq: Two times a day (BID) | ORAL | 0 refills | Status: AC | PRN
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
## 2019-07-28 NOTE — Discharge Instructions (Signed)
Take the ibuprofen every 8 hours for 3 days and then as needed  Take the flexeril after work to relieve muscle tightness and aid sleep  If pain worsens, you notice weakness or numbness please come back to be evaluated.  Follow up with the dermatologist supplied for your wart, you may also consider over the counter wart treatments. Keep this covered at work
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
## 2020-05-25 ENCOUNTER — Encounter (HOSPITAL_COMMUNITY): Payer: Self-pay

## 2020-05-25 ENCOUNTER — Ambulatory Visit (HOSPITAL_COMMUNITY)
Admission: EM | Admit: 2020-05-25 | Discharge: 2020-05-25 | Disposition: A | Discharge status: Home / Self Care | Payer: BC Managed Care – PPO | Attending: Family Medicine | Admitting: Family Medicine

## 2020-05-25 ENCOUNTER — Other Ambulatory Visit: Payer: Self-pay

## 2020-05-25 DIAGNOSIS — Z113 Encounter for screening for infections with a predominantly sexual mode of transmission: Secondary | ICD-10-CM | POA: Diagnosis not present

## 2020-05-25 DIAGNOSIS — Z880 Allergy status to penicillin: Secondary | ICD-10-CM | POA: Insufficient documentation

## 2020-05-25 DIAGNOSIS — R350 Frequency of micturition: Secondary | ICD-10-CM | POA: Insufficient documentation

## 2020-05-25 LAB — POCT URINALYSIS DIPSTICK, ED / UC
Bilirubin Urine: NEGATIVE
Glucose, UA: NEGATIVE mg/dL
Hgb urine dipstick: NEGATIVE
Ketones, ur: NEGATIVE mg/dL
Leukocytes,Ua: NEGATIVE
Nitrite: NEGATIVE
Protein, ur: NEGATIVE mg/dL
Specific Gravity, Urine: 1.025 (ref 1.005–1.030)
Urobilinogen, UA: 0.2 mg/dL (ref 0.0–1.0)
pH: 7 (ref 5.0–8.0)

## 2020-05-25 NOTE — ED Triage Notes (Signed)
Pt states she has a UTI x 3 days.

## 2020-05-25 NOTE — ED Provider Notes (Signed)
[facility]    CSN: [id number] Arrival date & time: 05/25/20  0815      History   Chief Complaint No chief complaint on file.   HPI [name] is a 29 y.o. female.   Here today with 3 day history of urinary frequency, suprapubic discomfort. Denies vaginal discharge, flank pain, dysuria, hematuria, fever, N/V/D, concern for STI or pregnancy. Has IUD in place. Not trying anything OTC for sxs. No hx of UTIs per patient.      Past Medical History:  Diagnosis Date  . Medical history non-contributory     Patient Active Problem List   Diagnosis Date Noted  . Pregnancy 12/12/2013  . Pregnancy, supervision of first 12/02/2013  . Hyperemesis affecting pregnancy, antepartum 12/02/2013  . Maternal varicella, non-immune 12/02/2013  . Need for maternal serum alpha-protein (MSAFP) screening 12/02/2013  . Preterm uterine contractions, antepartum 09/14/2013    Past Surgical History:  Procedure Laterality Date  . NO PAST SURGERIES      OB History    Gravida  1   Para  1   Term  1   Preterm      AB      Living  1     SAB      IAB      Ectopic      Multiple      Live Births  1            Home Medications    Prior to Admission medications   Medication Sig Start Date End Date Taking? Authorizing Provider  cyclobenzaprine (FLEXERIL) 10 MG tablet Take 1 tablet (10 mg total) by mouth 2 (two) times daily as needed for muscle spasms. 07/28/19   Darr, Gerilyn Pilgrim, PA-C  flintstones complete (FLINTSTONES) 60 MG chewable tablet Chew 2 tablets by mouth daily.    [provider]  ibuprofen (ADVIL) 800 MG tablet Take 1 tablet (800 mg total) by mouth 3 (three) times daily. 07/28/19   Darr, Gerilyn Pilgrim, PA-C    Family History Family History  Problem Relation Age of Onset  . Hypertension Mother   . Diabetes Maternal Grandmother     Social History Social History   Tobacco Use  . Smoking status: Never Smoker  . Smokeless tobacco: Never Used  Vaping  Use  . Vaping Use: Never used  Substance Use Topics  . Alcohol use: Yes    Alcohol/week: 1.0 standard drink    Types: 1 Cans of beer per week  . Drug use: Yes    Frequency: 3.0 times per week    Types: Marijuana     Allergies   Amoxicillin   Review of Systems Review of Systems PER HPI    Physical Exam Triage Vital Signs ED Triage Vitals  Enc Vitals Group     BP 05/25/20 0824 107/67     Pulse Rate 05/25/20 0824 63     Resp 05/25/20 0824 16     Temp 05/25/20 0824 98.3 F (36.8 C)     Temp Source 05/25/20 0824 Oral     SpO2 05/25/20 0824 100 %     Weight 05/25/20 0827 121 lb 9.6 oz (55.2 kg)     Height 05/25/20 0827 5\' 5"  (1.651 m)     Head Circumference --      Peak Flow --      Pain Score 05/25/20 0826 1     Pain Loc --      Pain Edu? --  Excl. in GC? --    No data found.  Updated Vital Signs BP 107/67 (BP Location: Left Arm)   Pulse 63   Temp 98.3 F (36.8 C) (Oral)   Resp 16   Ht 5\' 5"  (1.651 m)   Wt 121 lb 9.6 oz (55.2 kg)   SpO2 100%   BMI 20.24 kg/m   Visual Acuity Right Eye Distance:   Left Eye Distance:   Bilateral Distance:    Right Eye Near:   Left Eye Near:    Bilateral Near:     Physical Exam Vitals and nursing note reviewed.  Constitutional:      Appearance: Normal appearance. She is not ill-appearing.  HENT:     Head: Atraumatic.     Mouth/Throat:     Mouth: Mucous membranes are moist.     Pharynx: Oropharynx is clear.  Eyes:     Extraocular Movements: Extraocular movements intact.     Conjunctiva/sclera: Conjunctivae normal.  Cardiovascular:     Rate and Rhythm: Normal rate and regular rhythm.     Heart sounds: Normal heart sounds.  Pulmonary:     Effort: Pulmonary effort is normal.     Breath sounds: Normal breath sounds.  Abdominal:     General: Bowel sounds are normal. There is no distension.     Palpations: Abdomen is soft.     Tenderness: There is abdominal tenderness (minimal suprapubic ttp). There is no  right CVA tenderness, left CVA tenderness or guarding.  Musculoskeletal:        General: Normal range of motion.     Cervical back: Normal range of motion and neck supple.  Skin:    General: Skin is warm and dry.  Neurological:     Mental Status: She is alert and oriented to person, place, and time.  Psychiatric:        Mood and Affect: Mood normal.        Thought Content: Thought content normal.        Judgment: Judgment normal.      UC Treatments / Results  Labs (all labs ordered are listed, but only abnormal results are displayed) Labs Reviewed  POCT URINALYSIS DIPSTICK, ED / UC  CERVICOVAGINAL ANCILLARY ONLY    EKG   Radiology No results found.  Procedures Procedures (including critical care time)  Medications Ordered in UC Medications - No data to display  Initial Impression / Assessment and Plan / UC Course  I have reviewed the triage vital signs and the nursing notes.  Pertinent labs & imaging results that were available during my care of the patient were reviewed by me and considered in my medical decision making (see chart for details).     U/A neg for UTI, aptima swab pending. Discussed good vaginal hygiene, fluid intake. Return precautions reviewed.   Final Clinical Impressions(s) / UC Diagnoses   Final diagnoses:  Urinary frequency   Discharge Instructions   None    ED Prescriptions    None     PDMP not reviewed this encounter.   Elsie, [place] 05/25/20 [phone]

## 2020-05-26 LAB — CERVICOVAGINAL ANCILLARY ONLY
Bacterial Vaginitis (gardnerella): NEGATIVE
Candida Glabrata: NEGATIVE
Candida Vaginitis: NEGATIVE
Comment: NEGATIVE
Comment: NEGATIVE
Comment: NEGATIVE
